# Patient Record
Sex: Female | Born: 1975
Health system: Southern US, Community
[De-identification: ages and names within clinical notes are randomized; demographics above are authoritative.]

## PROBLEM LIST (undated history)

## (undated) ENCOUNTER — Inpatient Hospital Stay: Payer: Self-pay

## (undated) DIAGNOSIS — IMO0002 Reserved for concepts with insufficient information to code with codable children: Secondary | ICD-10-CM

## (undated) DIAGNOSIS — M545 Low back pain: Secondary | ICD-10-CM

## (undated) DIAGNOSIS — S83519A Sprain of anterior cruciate ligament of unspecified knee, initial encounter: Secondary | ICD-10-CM

## (undated) DIAGNOSIS — G43109 Migraine with aura, not intractable, without status migrainosus: Secondary | ICD-10-CM

## (undated) DIAGNOSIS — M1990 Unspecified osteoarthritis, unspecified site: Secondary | ICD-10-CM

## (undated) DIAGNOSIS — G43709 Chronic migraine without aura, not intractable, without status migrainosus: Secondary | ICD-10-CM

## (undated) DIAGNOSIS — J3089 Other allergic rhinitis: Secondary | ICD-10-CM

## (undated) DIAGNOSIS — Z20821 Contact with and (suspected) exposure to Zika virus: Secondary | ICD-10-CM

## (undated) DIAGNOSIS — L700 Acne vulgaris: Secondary | ICD-10-CM

## (undated) HISTORY — DX: Migraine with aura, not intractable, without status migrainosus: G43.109

## (undated) HISTORY — DX: Low back pain: M54.5

## (undated) HISTORY — DX: Other allergic rhinitis: J30.89

## (undated) HISTORY — DX: Contact with and (suspected) exposure to Zika virus: Z20.821

---
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
## 2008-07-04 DIAGNOSIS — M545 Low back pain, unspecified: Secondary | ICD-10-CM

## 2008-07-04 HISTORY — DX: Low back pain, unspecified: M54.50

## 2013-01-11 ENCOUNTER — Ambulatory Visit: Payer: Self-pay

## 2014-10-31 ENCOUNTER — Ambulatory Visit (INDEPENDENT_AMBULATORY_CARE_PROVIDER_SITE_OTHER): Payer: Federal, State, Local not specified - PPO | Admitting: Family Medicine

## 2014-10-31 ENCOUNTER — Encounter: Payer: Self-pay | Admitting: Family Medicine

## 2014-10-31 VITALS — BP 100/52 | HR 79 | Temp 98.2°F | Ht 65.75 in | Wt 175.8 lb

## 2014-10-31 DIAGNOSIS — J3089 Other allergic rhinitis: Secondary | ICD-10-CM

## 2014-10-31 DIAGNOSIS — G43709 Chronic migraine without aura, not intractable, without status migrainosus: Secondary | ICD-10-CM | POA: Insufficient documentation

## 2014-10-31 DIAGNOSIS — M545 Low back pain, unspecified: Secondary | ICD-10-CM

## 2014-10-31 DIAGNOSIS — E739 Lactose intolerance, unspecified: Secondary | ICD-10-CM

## 2014-10-31 DIAGNOSIS — Z0000 Encounter for general adult medical examination without abnormal findings: Secondary | ICD-10-CM

## 2014-10-31 DIAGNOSIS — F331 Major depressive disorder, recurrent, moderate: Secondary | ICD-10-CM | POA: Insufficient documentation

## 2014-10-31 DIAGNOSIS — Z8739 Personal history of other diseases of the musculoskeletal system and connective tissue: Secondary | ICD-10-CM | POA: Insufficient documentation

## 2014-10-31 DIAGNOSIS — E663 Overweight: Secondary | ICD-10-CM

## 2014-10-31 DIAGNOSIS — Z23 Encounter for immunization: Secondary | ICD-10-CM

## 2014-10-31 DIAGNOSIS — F329 Major depressive disorder, single episode, unspecified: Secondary | ICD-10-CM

## 2014-10-31 DIAGNOSIS — G43109 Migraine with aura, not intractable, without status migrainosus: Secondary | ICD-10-CM

## 2014-10-31 DIAGNOSIS — F32A Depression, unspecified: Secondary | ICD-10-CM

## 2014-10-31 LAB — LIPID PANEL
CHOLESTEROL: 149 mg/dL (ref 0–200)
HDL: 45.1 mg/dL (ref >39.00)
LDL Cholesterol: 93 mg/dL (ref 0–99)
NonHDL: 103.9
TRIGLYCERIDES: 57 mg/dL (ref 0.0–149.0)
Total CHOL/HDL Ratio: 3
VLDL: 11.4 mg/dL (ref 0.0–40.0)

## 2014-10-31 LAB — COMPREHENSIVE METABOLIC PANEL
ALK PHOS: 60 U/L (ref 39–117)
ALT: 18 U/L (ref 0–35)
AST: 19 U/L (ref 0–37)
Albumin: 4.2 g/dL (ref 3.5–5.2)
BUN: 7 mg/dL (ref 6–23)
CO2: 27 meq/L (ref 19–32)
Calcium: 9.7 mg/dL (ref 8.4–10.5)
Chloride: 103 mEq/L (ref 96–112)
Creatinine, Ser: 0.86 mg/dL (ref 0.40–1.20)
GFR: 79.16 mL/min (ref >60.00)
Glucose, Bld: 77 mg/dL (ref 70–99)
POTASSIUM: 4 meq/L (ref 3.5–5.1)
Sodium: 136 mEq/L (ref 135–145)
Total Bilirubin: 0.4 mg/dL (ref 0.2–1.2)
Total Protein: 7 g/dL (ref 6.0–8.3)

## 2014-10-31 LAB — TSH: TSH: 0.64 u[IU]/mL (ref 0.35–4.50)

## 2014-10-31 MED ORDER — LORATADINE-PSEUDOEPHEDRINE ER 10-240 MG PO TB24: 1.0000 | ORAL_TABLET | Freq: Every day | ORAL | Status: DC

## 2014-10-31 MED ORDER — SUMATRIPTAN SUCCINATE 100 MG PO TABS: 100.0000 mg | ORAL_TABLET | ORAL | Status: DC | PRN

## 2014-10-31 NOTE — Assessment & Plan Note (Signed)
Preventative protocols reviewed and updated unless pt declined. Discussed healthy diet and lifestyle.  

## 2014-10-31 NOTE — Progress Notes (Signed)
Pre visit review using our clinic review tool, if applicable. No additional management support is needed unless otherwise documented below in the visit note. 

## 2014-10-31 NOTE — Assessment & Plan Note (Signed)
Continue wellbutrin and prozac and f/u with counselor. Somewhat situational - migraine related

## 2014-10-31 NOTE — Patient Instructions (Addendum)
Tdap today Blood work today. Let us know about well woman exam. meds refilled today. Return as needed or in 1 year for next physical.

## 2014-10-31 NOTE — Progress Notes (Signed)
BP 100/52 mmHg  Pulse 79  Temp(Src) 98.2 F (36.8 C) (Oral)  Ht 5' 5.75" (1.67 m)  Wt 175 lb 12 oz (79.72 kg)  BMI 28.58 kg/m2  SpO2 99%  LMP 10/12/2014   CC: new pt to establish  Subjective:    Patient ID: Amanda Stout, female    DOB: 06/21/1976, 39 y.o.   MRN: [id number]  HPI: Amanda Stout is a 39 y.o. female presenting on 10/31/2014 for Establish Care   Prior saw Willia Craze at [facility].  LBP - L4/5 burst disc, treated conservatively. Also had unsuccessful nerve blocks.   Migraines - daily migraines for 20 yrs. Has seen multiple neurologists in the past. Has been multiple daily meds without improvement (topamax, elavil, propranolol, depakote). Now just taking imitrex. Triggers are peri-cycle and weather changes.   Depression - migraine related. On wellbutrin and prozac. To see counselor Kirstie Mirza Alcott.   Preventative: Well woman - Dr Dear, normal pap 11/2013. H/o abnormal paps in the past.  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 Interim medical history since our last visit reviewed. Allergies and medications reviewed and updated. No current outpatient prescriptions on file prior to visit.   No current facility-administered medications on file prior to visit.    Review of Systems  Constitutional: Negative for fever, chills, activity change, appetite change, fatigue and unexpected weight change.  HENT: Negative for hearing loss.   Eyes: Negative for visual disturbance.  Respiratory: Negative for cough, chest tightness, shortness of breath and wheezing.   Cardiovascular: Negative for chest pain, palpitations and  leg swelling.  Gastrointestinal: Positive for nausea (migraines). Negative for vomiting, abdominal pain, diarrhea, constipation, blood in stool and abdominal distention.  Genitourinary: Negative for hematuria and difficulty urinating.  Musculoskeletal: Negative for myalgias, arthralgias and neck pain.  Skin: Negative for rash.  Neurological: Positive for dizziness (migraine related) and headaches. Negative for seizures and syncope.  Hematological: Negative for adenopathy. Does not bruise/bleed easily.  Psychiatric/Behavioral: Negative for dysphoric mood. The patient is not nervous/anxious.    Per HPI unless specifically indicated above     Objective:    BP 100/52 mmHg  Pulse 79  Temp(Src) 98.2 F (36.8 C) (Oral)  Ht 5' 5.75" (1.67 m)  Wt 175 lb 12 oz (79.72 kg)  BMI 28.58 kg/m2  SpO2 99%  LMP 10/12/2014  Wt Readings from Last 3 Encounters:  10/31/14 175 lb 12 oz (79.72 kg)    Physical Exam  Constitutional: She is oriented to person, place, and time. She appears well-developed and well-nourished. No distress.  HENT:  Head: Normocephalic and atraumatic.  Right Ear: Hearing, tympanic membrane, external ear and ear canal normal.  Left Ear: Hearing, tympanic membrane, external ear and ear canal normal.  Nose: Nose normal.  Mouth/Throat: Uvula is midline, oropharynx is clear and moist and mucous membranes are normal. No oropharyngeal exudate, posterior oropharyngeal edema or posterior oropharyngeal erythema.  Eyes: Conjunctivae and EOM are normal. Pupils are equal, round, and reactive to light. No scleral icterus.  Neck: Normal range of motion. Neck supple. No thyromegaly present.  Cardiovascular: Normal rate, regular rhythm, normal heart sounds and intact distal pulses.   No murmur heard.  Pulses:      Radial pulses are 2+ on the right side, and 2+ on the left side.  Pulmonary/Chest: Effort normal and breath sounds normal. No respiratory distress. She has no wheezes. She has no  rales.  Abdominal: Soft. Bowel sounds are normal. She exhibits no distension and no mass. There is no tenderness. There is no rebound and no guarding.  Musculoskeletal: Normal range of motion. She exhibits no edema.  Lymphadenopathy:    She has no cervical adenopathy.  Neurological: She is alert and oriented to person, place, and time.  CN grossly intact, station and gait intact  Skin: Skin is warm and dry. No rash noted.  Psychiatric: She has a normal mood and affect. Her behavior is normal. Judgment and thought content normal.  Nursing note and vitals reviewed.  No results found for this or any previous visit.    Assessment & Plan:   Problem List Items Addressed This Visit    Perennial allergic rhinitis    claritin D refiled today.      Migraines    Failed several preventative meds in the past. Now only using imitrex prn.      Relevant Medications   Aspirin-Acetaminophen-Caffeine (EXCEDRIN MIGRAINE PO)   ibuprofen (ADVIL,MOTRIN) 200 MG tablet   baclofen (LIORESAL) 10 MG tablet   buPROPion (WELLBUTRIN XL) 150 MG 24 hr tablet   FLUoxetine (PROZAC) 20 MG tablet   SUMAtriptan (IMITREX) 100 MG tablet   Lower back pain    Known HNP s/p unsuccessful nerve block Now controls with PRN ibuprofen.      Relevant Medications   Aspirin-Acetaminophen-Caffeine (EXCEDRIN MIGRAINE PO)   ibuprofen (ADVIL,MOTRIN) 200 MG tablet   baclofen (LIORESAL) 10 MG tablet   Lactose intolerance in adult   Health maintenance examination - Primary    Preventative protocols reviewed and updated unless pt declined. Discussed healthy diet and lifestyle.       Depression    Continue wellbutrin and prozac and f/u with counselor. Somewhat situational - migraine related      Relevant Medications   buPROPion (WELLBUTRIN XL) 150 MG 24 hr tablet   FLUoxetine (PROZAC) 20 MG tablet       Follow up plan: Return in about 1 year (around 10/31/2015), or as needed, for annual exam, prior fasting for blood  work.

## 2014-10-31 NOTE — Assessment & Plan Note (Addendum)
Known HNP s/p unsuccessful nerve block Now controls with PRN ibuprofen.

## 2014-10-31 NOTE — Assessment & Plan Note (Signed)
claritin D refiled today.

## 2014-10-31 NOTE — Addendum Note (Signed)
Addended by: Modena Nunnery on: 10/31/2014 10:37 AM   Modules accepted: Orders

## 2014-10-31 NOTE — Assessment & Plan Note (Signed)
Failed several preventative meds in the past. Now only using imitrex prn.

## 2014-10-31 NOTE — Addendum Note (Signed)
Addended by: Ria Bush on: 10/31/2014 10:31 AM   Modules accepted: Orders

## 2014-11-03 ENCOUNTER — Encounter: Payer: Self-pay | Admitting: *Deleted

## 2014-11-15 ENCOUNTER — Encounter: Payer: Self-pay | Admitting: Family Medicine

## 2015-04-03 ENCOUNTER — Other Ambulatory Visit: Payer: Self-pay | Admitting: Orthopedic Surgery

## 2015-04-04 DIAGNOSIS — S83519A Sprain of anterior cruciate ligament of unspecified knee, initial encounter: Secondary | ICD-10-CM

## 2015-04-04 HISTORY — DX: Sprain of anterior cruciate ligament of unspecified knee, initial encounter: S83.519A

## 2015-04-06 ENCOUNTER — Encounter (HOSPITAL_BASED_OUTPATIENT_CLINIC_OR_DEPARTMENT_OTHER): Payer: Self-pay | Admitting: *Deleted

## 2015-04-08 ENCOUNTER — Other Ambulatory Visit: Payer: Self-pay | Admitting: Orthopedic Surgery

## 2015-04-09 NOTE — Anesthesia Preprocedure Evaluation (Addendum)
Anesthesia Evaluation  Patient identified by MRN, date of birth, ID band Patient awake    Reviewed: Allergy & Precautions, NPO status , Patient's Chart, lab work & pertinent test results  Airway Mallampati: II  TM Distance: >3 FB Neck ROM: Full    Dental no notable dental hx.    Pulmonary neg pulmonary ROS,    Pulmonary exam normal breath sounds clear to auscultation       Cardiovascular negative cardio ROS Normal cardiovascular exam Rhythm:Regular Rate:Normal     Neuro/Psych  Headaches, PSYCHIATRIC DISORDERS Depression negative neurological ROS  negative psych ROS   GI/Hepatic negative GI ROS, Neg liver ROS,   Endo/Other  negative endocrine ROS  Renal/GU negative Renal ROS  negative genitourinary   Musculoskeletal negative musculoskeletal ROS (+)   Abdominal   Peds negative pediatric ROS (+)  Hematology negative hematology ROS (+)   Anesthesia Other Findings   Reproductive/Obstetrics negative OB ROS                            Anesthesia Physical Anesthesia Plan  ASA: I  Anesthesia Plan: General   Post-op Pain Management: GA combined w/ Regional for post-op pain   Induction: Intravenous  Airway Management Planned: LMA  Additional Equipment:   Intra-op Plan:   Post-operative Plan: Extubation in OR  Informed Consent: I have reviewed the patients History and Physical, chart, labs and discussed the procedure including the risks, benefits and alternatives for the proposed anesthesia with the patient or authorized representative who has indicated his/her understanding and acceptance.   Dental advisory given  Plan Discussed with: CRNA and Surgeon  Anesthesia Plan Comments:         Anesthesia Quick Evaluation

## 2015-04-10 ENCOUNTER — Ambulatory Visit (HOSPITAL_BASED_OUTPATIENT_CLINIC_OR_DEPARTMENT_OTHER)
Admission: RE | Admit: 2015-04-10 | Discharge: 2015-04-10 | Disposition: A | Discharge status: Home / Self Care | Payer: Federal, State, Local not specified - PPO | Source: Ambulatory Visit | Attending: Orthopedic Surgery | Admitting: Orthopedic Surgery

## 2015-04-10 ENCOUNTER — Ambulatory Visit (HOSPITAL_BASED_OUTPATIENT_CLINIC_OR_DEPARTMENT_OTHER): Payer: Federal, State, Local not specified - PPO | Admitting: Certified Registered"

## 2015-04-10 ENCOUNTER — Encounter (HOSPITAL_BASED_OUTPATIENT_CLINIC_OR_DEPARTMENT_OTHER): Payer: Self-pay | Admitting: Certified Registered"

## 2015-04-10 ENCOUNTER — Encounter (HOSPITAL_BASED_OUTPATIENT_CLINIC_OR_DEPARTMENT_OTHER)
Admission: RE | Disposition: A | Discharge status: Home / Self Care | Payer: Self-pay | Source: Ambulatory Visit | Attending: Orthopedic Surgery

## 2015-04-10 DIAGNOSIS — Z7982 Long term (current) use of aspirin: Secondary | ICD-10-CM | POA: Insufficient documentation

## 2015-04-10 DIAGNOSIS — M23611 Other spontaneous disruption of anterior cruciate ligament of right knee: Secondary | ICD-10-CM | POA: Diagnosis present

## 2015-04-10 DIAGNOSIS — S83511A Sprain of anterior cruciate ligament of right knee, initial encounter: Secondary | ICD-10-CM

## 2015-04-10 HISTORY — DX: Sprain of anterior cruciate ligament of unspecified knee, initial encounter: S83.519A

## 2015-04-10 HISTORY — DX: Chronic migraine without aura, not intractable, without status migrainosus: G43.709

## 2015-04-10 HISTORY — DX: Reserved for concepts with insufficient information to code with codable children: IMO0002

## 2015-04-10 HISTORY — DX: Sprain of anterior cruciate ligament of right knee, initial encounter: S83.511A

## 2015-04-10 HISTORY — DX: Unspecified osteoarthritis, unspecified site: M19.90

## 2015-04-10 HISTORY — PX: KNEE ARTHROSCOPY WITH ANTERIOR CRUCIATE LIGAMENT (ACL) REPAIR: SHX5644

## 2015-04-10 LAB — POCT HEMOGLOBIN-HEMACUE: HEMOGLOBIN: 13.2 g/dL (ref 12.0–15.0)

## 2015-04-10 SURGERY — KNEE ARTHROSCOPY WITH ANTERIOR CRUCIATE LIGAMENT (ACL) REPAIR
Anesthesia: General | Site: Knee | Laterality: Right

## 2015-04-10 MED ORDER — FENTANYL CITRATE (PF) 100 MCG/2ML IJ SOLN
INTRAMUSCULAR | Status: AC
  Filled 2015-04-10: qty 4

## 2015-04-10 MED ORDER — SENNA-DOCUSATE SODIUM 8.6-50 MG PO TABS: 2.0000 | ORAL_TABLET | Freq: Every day | ORAL | Status: DC

## 2015-04-10 MED ORDER — HYDROMORPHONE HCL 1 MG/ML IJ SOLN
INTRAMUSCULAR | Status: AC
  Filled 2015-04-10: qty 1

## 2015-04-10 MED ORDER — GLYCOPYRROLATE 0.2 MG/ML IJ SOLN: 0.2000 mg | Freq: Once | INTRAMUSCULAR | Status: DC | PRN

## 2015-04-10 MED ORDER — OXYCODONE HCL 5 MG PO TABS
ORAL_TABLET | ORAL | Status: AC
  Filled 2015-04-10: qty 1

## 2015-04-10 MED ORDER — KETOROLAC TROMETHAMINE 30 MG/ML IJ SOLN: 30.0000 mg | Freq: Once | INTRAMUSCULAR | Status: DC

## 2015-04-10 MED ORDER — OXYCODONE HCL 5 MG PO TABS
5.0000 mg | ORAL_TABLET | Freq: Once | ORAL | Status: AC
  Administered 2015-04-10: 5 mg via ORAL

## 2015-04-10 MED ORDER — MIDAZOLAM HCL 2 MG/2ML IJ SOLN
INTRAMUSCULAR | Status: AC
  Filled 2015-04-10: qty 4

## 2015-04-10 MED ORDER — PROMETHAZINE HCL 12.5 MG RE SUPP
RECTAL | Status: AC
  Filled 2015-04-10: qty 1

## 2015-04-10 MED ORDER — ONDANSETRON HCL 4 MG/2ML IJ SOLN
INTRAMUSCULAR | Status: AC
  Filled 2015-04-10: qty 2

## 2015-04-10 MED ORDER — LIDOCAINE HCL (CARDIAC) 20 MG/ML IV SOLN
INTRAVENOUS | Status: DC | PRN
  Administered 2015-04-10: 30 mg via INTRAVENOUS

## 2015-04-10 MED ORDER — LACTATED RINGERS IV SOLN
INTRAVENOUS | Status: DC
  Administered 2015-04-10 (×2): via INTRAVENOUS

## 2015-04-10 MED ORDER — FENTANYL CITRATE (PF) 100 MCG/2ML IJ SOLN
INTRAMUSCULAR | Status: AC
  Filled 2015-04-10: qty 2

## 2015-04-10 MED ORDER — ONDANSETRON HCL 4 MG/2ML IJ SOLN
INTRAMUSCULAR | Status: DC | PRN
  Administered 2015-04-10: 4 mg via INTRAVENOUS

## 2015-04-10 MED ORDER — SODIUM CHLORIDE 0.9 % IR SOLN
Status: DC | PRN
  Administered 2015-04-10: 3000 mL

## 2015-04-10 MED ORDER — CEFAZOLIN SODIUM-DEXTROSE 2-3 GM-% IV SOLR
2.0000 g | INTRAVENOUS | Status: AC
  Administered 2015-04-10: 2 g via INTRAVENOUS

## 2015-04-10 MED ORDER — SCOPOLAMINE 1 MG/3DAYS TD PT72: 1.0000 | MEDICATED_PATCH | Freq: Once | TRANSDERMAL | Status: DC | PRN

## 2015-04-10 MED ORDER — LIDOCAINE HCL (CARDIAC) 20 MG/ML IV SOLN
INTRAVENOUS | Status: AC
  Filled 2015-04-10: qty 5

## 2015-04-10 MED ORDER — LIDOCAINE-EPINEPHRINE (PF) 1.5 %-1:200000 IJ SOLN
INTRAMUSCULAR | Status: DC | PRN
  Administered 2015-04-10: 15 mL via PERINEURAL

## 2015-04-10 MED ORDER — MIDAZOLAM HCL 2 MG/2ML IJ SOLN
INTRAMUSCULAR | Status: AC
  Filled 2015-04-10: qty 2

## 2015-04-10 MED ORDER — CEFAZOLIN SODIUM-DEXTROSE 2-3 GM-% IV SOLR
INTRAVENOUS | Status: AC
  Filled 2015-04-10: qty 50

## 2015-04-10 MED ORDER — PROPOFOL 500 MG/50ML IV EMUL
INTRAVENOUS | Status: AC
  Filled 2015-04-10: qty 50

## 2015-04-10 MED ORDER — MIDAZOLAM HCL 2 MG/2ML IJ SOLN
1.0000 mg | INTRAMUSCULAR | Status: DC | PRN
Start: 2015-04-10 — End: 2015-04-10
  Administered 2015-04-10: 2 mg via INTRAVENOUS
  Administered 2015-04-10: 1 mg via INTRAVENOUS

## 2015-04-10 MED ORDER — PROMETHAZINE HCL 25 MG/ML IJ SOLN: 6.2500 mg | INTRAMUSCULAR | Status: DC | PRN

## 2015-04-10 MED ORDER — FENTANYL CITRATE (PF) 100 MCG/2ML IJ SOLN
50.0000 ug | INTRAMUSCULAR | Status: AC | PRN
  Administered 2015-04-10: 100 ug via INTRAVENOUS
  Administered 2015-04-10 (×2): 25 ug via INTRAVENOUS
  Administered 2015-04-10: 50 ug via INTRAVENOUS

## 2015-04-10 MED ORDER — BUPIVACAINE HCL (PF) 0.5 % IJ SOLN
INTRAMUSCULAR | Status: DC | PRN
  Administered 2015-04-10: 30 mL via PERINEURAL

## 2015-04-10 MED ORDER — DEXAMETHASONE SODIUM PHOSPHATE 10 MG/ML IJ SOLN
INTRAMUSCULAR | Status: DC | PRN
  Administered 2015-04-10: 10 mg via INTRAVENOUS

## 2015-04-10 MED ORDER — DEXAMETHASONE SODIUM PHOSPHATE 10 MG/ML IJ SOLN
INTRAMUSCULAR | Status: AC
  Filled 2015-04-10: qty 1

## 2015-04-10 MED ORDER — ONDANSETRON HCL 4 MG PO TABS: 4.0000 mg | ORAL_TABLET | Freq: Three times a day (TID) | ORAL | Status: DC | PRN

## 2015-04-10 MED ORDER — CEFAZOLIN SODIUM-DEXTROSE 2-3 GM-% IV SOLR: 2.0000 g | INTRAVENOUS | Status: DC

## 2015-04-10 MED ORDER — HYDROMORPHONE HCL 1 MG/ML IJ SOLN
0.2500 mg | INTRAMUSCULAR | Status: DC | PRN
  Administered 2015-04-10 (×3): 0.5 mg via INTRAVENOUS

## 2015-04-10 MED ORDER — BACLOFEN 10 MG PO TABS: 10.0000 mg | ORAL_TABLET | Freq: Three times a day (TID) | ORAL | Status: DC

## 2015-04-10 MED ORDER — PROPOFOL 10 MG/ML IV BOLUS
INTRAVENOUS | Status: DC | PRN
  Administered 2015-04-10: 200 mg via INTRAVENOUS

## 2015-04-10 MED ORDER — OXYCODONE-ACETAMINOPHEN 10-325 MG PO TABS: 1.0000 | ORAL_TABLET | Freq: Four times a day (QID) | ORAL | Status: DC | PRN

## 2015-04-10 SURGICAL SUPPLY — 92 items
ANCHOR BUTTON TIGHTROPE ACL RT (Orthopedic Implant) ×2 IMPLANT
BANDAGE ELASTIC 6 VELCRO ST LF (GAUZE/BANDAGES/DRESSINGS) ×2 IMPLANT
BANDAGE ESMARK 6X9 LF (GAUZE/BANDAGES/DRESSINGS) IMPLANT
BLADE 4.2CUDA (BLADE) IMPLANT
BLADE CUDA GRT WHITE 3.5 (BLADE) IMPLANT
BLADE CUDA SHAVER 3.5 (BLADE) IMPLANT
BLADE CUTTER GATOR 3.5 (BLADE) ×2 IMPLANT
BLADE GREAT WHITE 4.2 (BLADE) IMPLANT
BLADE SURG 15 STRL LF DISP TIS (BLADE) ×1 IMPLANT
BLADE SURG 15 STRL SS (BLADE) ×1
BNDG ESMARK 6X9 LF (GAUZE/BANDAGES/DRESSINGS)
BUR OVAL 6.0 (BURR) ×2 IMPLANT
CLSR STERI-STRIP ANTIMIC 1/2X4 (GAUZE/BANDAGES/DRESSINGS) ×2 IMPLANT
COVER BACK TABLE 60X90IN (DRAPES) ×2 IMPLANT
CUFF TOURNIQUET SINGLE 34IN LL (TOURNIQUET CUFF) ×2 IMPLANT
CUTTER FLIP II 9.5MM (INSTRUMENTS) IMPLANT
CUTTER MENISCUS  4.2MM (BLADE)
CUTTER MENISCUS 4.2MM (BLADE) IMPLANT
DECANTER SPIKE VIAL GLASS SM (MISCELLANEOUS) IMPLANT
DRAPE ARTHROSCOPY W/POUCH 90 (DRAPES) ×2 IMPLANT
DRAPE OEC MINIVIEW 54X84 (DRAPES) ×2 IMPLANT
DRAPE U 20/CS (DRAPES) ×4 IMPLANT
DRAPE U-SHAPE 47X51 STRL (DRAPES) ×2 IMPLANT
DRILL FLIPCUTTER II 10.5MM (CUTTER) IMPLANT
DRILL FLIPCUTTER II 10MM (CUTTER) IMPLANT
DRILL FLIPCUTTER II 7.0MM (INSTRUMENTS) IMPLANT
DRILL FLIPCUTTER II 7.5MM (MISCELLANEOUS) IMPLANT
DRILL FLIPCUTTER II 8.0MM (INSTRUMENTS) IMPLANT
DRILL FLIPCUTTER II 8.5MM (INSTRUMENTS) IMPLANT
DRILL FLIPCUTTER II 9.0MM (INSTRUMENTS) ×1 IMPLANT
DURAPREP 26ML APPLICATOR (WOUND CARE) ×2 IMPLANT
ELECT REM PT RETURN 9FT ADLT (ELECTROSURGICAL) ×2
ELECTRODE REM PT RTRN 9FT ADLT (ELECTROSURGICAL) ×1 IMPLANT
FIBERSTICK 2 (SUTURE) ×2 IMPLANT
FLIP CUTTER II 7.0MM (INSTRUMENTS)
FLIPCUTTER II 10.5MM (CUTTER)
FLIPCUTTER II 10MM (CUTTER)
FLIPCUTTER II 7.5MM (MISCELLANEOUS)
FLIPCUTTER II 8.0MM (INSTRUMENTS)
FLIPCUTTER II 8.5MM (INSTRUMENTS)
FLIPCUTTER II 9.0MM (INSTRUMENTS) ×2
GAUZE SPONGE 4X4 12PLY STRL (GAUZE/BANDAGES/DRESSINGS) ×2 IMPLANT
GLOVE BIO SURGEON STRL SZ 6.5 (GLOVE) ×4 IMPLANT
GLOVE BIO SURGEON STRL SZ8 (GLOVE) ×2 IMPLANT
GLOVE BIOGEL PI IND STRL 7.0 (GLOVE) ×1 IMPLANT
GLOVE BIOGEL PI IND STRL 8 (GLOVE) ×2 IMPLANT
GLOVE BIOGEL PI INDICATOR 7.0 (GLOVE) ×1
GLOVE BIOGEL PI INDICATOR 8 (GLOVE) ×2
GLOVE ORTHO TXT STRL SZ7.5 (GLOVE) ×2 IMPLANT
GOWN STRL REUS W/ TWL LRG LVL3 (GOWN DISPOSABLE) ×1 IMPLANT
GOWN STRL REUS W/ TWL XL LVL3 (GOWN DISPOSABLE) ×2 IMPLANT
GOWN STRL REUS W/TWL LRG LVL3 (GOWN DISPOSABLE) ×1
GOWN STRL REUS W/TWL XL LVL3 (GOWN DISPOSABLE) ×2
GUIDEPIN REAMER CUTTER 11MM (INSTRUMENTS) IMPLANT
IMMOBILIZER KNEE 22 UNIV (SOFTGOODS) ×2 IMPLANT
IMMOBILIZER KNEE 24 THIGH 36 (MISCELLANEOUS) ×1 IMPLANT
IMMOBILIZER KNEE 24 UNIV (MISCELLANEOUS) ×2
IV NS IRRIG 3000ML ARTHROMATIC (IV SOLUTION) ×4 IMPLANT
KIT TRANSTIBIAL (DISPOSABLE) IMPLANT
KNEE WRAP E Z 3 GEL PACK (MISCELLANEOUS) ×2 IMPLANT
MANIFOLD NEPTUNE II (INSTRUMENTS) ×2 IMPLANT
NS IRRIG 1000ML POUR BTL (IV SOLUTION) ×2 IMPLANT
PACK ARTHROSCOPY DSU (CUSTOM PROCEDURE TRAY) ×2 IMPLANT
PACK BASIN DAY SURGERY FS (CUSTOM PROCEDURE TRAY) ×2 IMPLANT
PAD CAST 4YDX4 CTTN HI CHSV (CAST SUPPLIES) IMPLANT
PADDING CAST COTTON 4X4 STRL (CAST SUPPLIES)
PADDING CAST COTTON 6X4 STRL (CAST SUPPLIES) ×2 IMPLANT
PENCIL BUTTON HOLSTER BLD 10FT (ELECTRODE) IMPLANT
SCREW INTERFERENCE BIOCOMPOSITE 9 X 28MM (Screw) ×2 IMPLANT
SET ARTHROSCOPY TUBING (MISCELLANEOUS) ×1
SET ARTHROSCOPY TUBING LN (MISCELLANEOUS) ×1 IMPLANT
SLEEVE SCD COMPRESS KNEE MED (MISCELLANEOUS) ×2 IMPLANT
SPONGE LAP 4X18 X RAY DECT (DISPOSABLE) ×2 IMPLANT
SUCTION FRAZIER TIP 10 FR DISP (SUCTIONS) IMPLANT
SUT 2 FIBERLOOP 20 STRT BLUE (SUTURE) ×4
SUT FIBERWIRE #2 38 T-5 BLUE (SUTURE)
SUT MNCRL AB 4-0 PS2 18 (SUTURE) IMPLANT
SUT VIC AB 0 CT1 27 (SUTURE)
SUT VIC AB 0 CT1 27XBRD ANBCTR (SUTURE) IMPLANT
SUT VIC AB 2-0 SH 27 (SUTURE)
SUT VIC AB 2-0 SH 27XBRD (SUTURE) IMPLANT
SUT VIC AB 3-0 SH 27 (SUTURE)
SUT VIC AB 3-0 SH 27X BRD (SUTURE) IMPLANT
SUT VICRYL 3-0 CR8 SH (SUTURE) IMPLANT
SUT VICRYL 4-0 PS2 18IN ABS (SUTURE) IMPLANT
SUTURE 2 FIBERLOOP 20 STRT BLU (SUTURE) ×2 IMPLANT
SUTURE FIBERWR #2 38 T-5 BLUE (SUTURE) IMPLANT
TENDON ANTERIOR TIBIALIS (Tissue) ×2 IMPLANT
TOWEL OR 17X24 6PK STRL BLUE (TOWEL DISPOSABLE) ×2 IMPLANT
TOWEL OR NON WOVEN STRL DISP B (DISPOSABLE) ×2 IMPLANT
WAND STAR VAC 90 (SURGICAL WAND) ×2 IMPLANT
WATER STERILE IRR 1000ML POUR (IV SOLUTION) ×2 IMPLANT

## 2015-04-10 NOTE — Discharge Instructions (Signed)
Diet: As you were doing prior to hospitalization  ° °Shower:  May shower but keep the wounds dry, use an occlusive plastic wrap, NO SOAKING IN TUB.  If the bandage gets wet, change with a clean dry gauze.  If you have a splint on, leave the splint in place and keep the splint dry with a plastic bag. ° °Dressing:  You may change your dressing 3-5 days after surgery, unless you have a splint.  If you have a splint, then just leave the splint in place and we will change your bandages during your first follow-up appointment.   ° °If you had hand or foot surgery, we will plan to remove your stitches in about 2 weeks in the office.  For all other surgeries, there are sticky tapes (steri-strips) on your wounds and all the stitches are absorbable.  Leave the steri-strips in place when changing your dressings, they will peel off with time, usually 2-3 weeks. ° °Activity:  Increase activity slowly as tolerated, but follow the weight bearing instructions below.  The rules on driving is that you can not be taking narcotics while you drive, and you must feel in control of the vehicle.   ° °Weight Bearing:   As tolerated.   ° °To prevent constipation: you may use a stool softener such as - ° °Colace (over the counter) 100 mg by mouth twice a day  °Drink plenty of fluids (prune juice may be helpful) and high fiber foods °Miralax (over the counter) for constipation as needed.   ° °Itching:  If you experience itching with your medications, try taking only a single pain pill, or even half a pain pill at a time.  You may take up to 10 pain pills per day, and you can also use benadryl over the counter for itching or also to help with sleep.  ° °Precautions:  If you experience chest pain or shortness of breath - call 911 immediately for transfer to the hospital emergency department!! ° °If you develop a fever greater that 101 F, purulent drainage from wound, increased redness or drainage from wound, or calf pain -- Call the office at  [phone]                                                °Follow- Up Appointment:  Please call for an appointment to be seen in 2 weeks [place] - [phone] ° ° ° ° ° °Post Anesthesia Home Care Instructions ° °Activity: °Get plenty of rest for the remainder of the day. A responsible adult should stay with you for 24 hours following the procedure.  °For the next 24 hours, DO NOT: °-Drive a car °-Operate machinery °-Drink alcoholic beverages °-Take any medication unless instructed by your physician °-Make any legal decisions or sign important papers. ° °Meals: °Start with liquid foods such as gelatin or soup. Progress to regular foods as tolerated. Avoid greasy, spicy, heavy foods. If nausea and/or vomiting occur, drink only clear liquids until the nausea and/or vomiting subsides. Call your physician if vomiting continues. ° °Special Instructions/Symptoms: °Your throat may feel dry or sore from the anesthesia or the breathing tube placed in your throat during surgery. If this causes discomfort, gargle with warm salt water. The discomfort should disappear within 24 hours. ° °If you had a scopolamine patch placed behind your ear for the management   of post- operative nausea and/or vomiting:  1. The medication in the patch is effective for 72 hours, after which it should be removed.  Wrap patch in a tissue and discard in the trash. Wash hands thoroughly with soap and water. 2. You may remove the patch earlier than 72 hours if you experience unpleasant side effects which may include dry mouth, dizziness or visual disturbances. 3. Avoid touching the patch. Wash your hands with soap and water after contact with the patch.     Regional Anesthesia Blocks  1. Numbness or the inability to move the "blocked" extremity may last from 3-48 hours after placement. The length of time depends on the medication injected and your individual response to the medication. If the numbness is not going away after 48 hours,  call your surgeon.  2. The extremity that is blocked will need to be protected until the numbness is gone and the  Strength has returned. Because you cannot feel it, you will need to take extra care to avoid injury. Because it may be weak, you may have difficulty moving it or using it. You may not know what position it is in without looking at it while the block is in effect.  3. For blocks in the legs and feet, returning to weight bearing and walking needs to be done carefully. You will need to wait until the numbness is entirely gone and the strength has returned. You should be able to move your leg and foot normally before you try and bear weight or walk. You will need someone to be with you when you first try to ensure you do not fall and possibly risk injury.  4. Bruising and tenderness at the needle site are common side effects and will resolve in a few days.  5. Persistent numbness or new problems with movement should be communicated to the surgeon or the [place] [phone] [place] [phone]).

## 2015-04-10 NOTE — Transfer of Care (Signed)
Immediate Anesthesia Transfer of Care Note  Patient: Amanda Stout  Procedure(s) Performed: Procedure(s): RIGHT KNEE ARTHROSCOPY WITH ANTERIOR CRUCIATE LIGAMENT (ACL) REPAIR (Right)  Patient Location: PACU  Anesthesia Type:GA combined with regional for post-op pain  Level of Consciousness: awake, alert , oriented and patient cooperative  Airway & Oxygen Therapy: Patient Spontanous Breathing and Patient connected to face mask oxygen  Post-op Assessment: Report given to RN and Post -op Vital signs reviewed and stable  Post vital signs: Reviewed and stable  Last Vitals:  Filed Vitals:   04/10/15 0911  BP:   Pulse: 77  Temp:   Resp: 17    Complications: No apparent anesthesia complications

## 2015-04-10 NOTE — Progress Notes (Signed)
Assisted Dr. Kalman Shan with right, ultrasound guided, lumbar plexis popliteal block. Side rails up, monitors on throughout procedure. See vital signs in flow sheet. Tolerated Procedure well.

## 2015-04-10 NOTE — Anesthesia Postprocedure Evaluation (Signed)
  Anesthesia Post-op Note  Patient: Amanda Stout  Procedure(s) Performed: Procedure(s) (LRB): RIGHT KNEE ARTHROSCOPY WITH ANTERIOR CRUCIATE LIGAMENT (ACL) REPAIR (Right)  Patient Location: PACU  Anesthesia Type: GA combined with regional for post-op pain  Level of Consciousness: awake and alert   Airway and Oxygen Therapy: Patient Spontanous Breathing  Post-op Pain: mild  Post-op Assessment: Post-op Vital signs reviewed, Patient's Cardiovascular Status Stable, Respiratory Function Stable, Patent Airway and No signs of Nausea or vomiting  Last Vitals:  Filed Vitals:   04/10/15 1112  BP: 101/64  Pulse: 76  Temp: 36.7 C  Resp: 18    Post-op Vital Signs: stable   Complications: No apparent anesthesia complications

## 2015-04-10 NOTE — Anesthesia Procedure Notes (Addendum)
Anesthesia Regional Block:  Femoral nerve block  Pre-Anesthetic Checklist: ,, timeout performed, Correct Patient, Correct Site, Correct Laterality, Correct Procedure, Correct Position, site marked, Risks and benefits discussed,  Surgical consent,  Pre-op evaluation,  At surgeon's request and post-op pain management  Laterality: Right  Prep: chloraprep       Needles:  Injection technique: Single-shot  Needle Type: Stimiplex     Needle Length: 9cm 9 cm Needle Gauge: 21 and 21 G    Additional Needles:  Procedures: nerve stimulator Femoral nerve block  Nerve Stimulator or Paresthesia:  Response: 0.4 mA,   Additional Responses:   Narrative:  Injection made incrementally with aspirations every 5 mL.  Performed by: Personally   Additional Notes: Patient tolerated the procedure well without complications  This was a lumbar plexus block   Anesthesia Regional Block:  Popliteal block  Pre-Anesthetic Checklist: ,, timeout performed, Correct Patient, Correct Site, Correct Laterality, Correct Procedure, Correct Position, site marked, Risks and benefits discussed,  Surgical consent,  Pre-op evaluation,  At surgeon's request and post-op pain management  Laterality: Right  Prep: chloraprep       Needles:  Injection technique: Single-shot  Needle Type: Echogenic Stimulator Needle     Needle Length: 9cm 9 cm Needle Gauge: 21 and 21 G    Additional Needles:  Procedures: ultrasound guided (picture in chart) Popliteal block  Nerve Stimulator or Paresthesia:  Response: 0.5 mA,   Additional Responses:   Narrative:  Injection made incrementally with aspirations every 5 mL.  Performed by: Personally   Additional Notes: Patient tolerated the procedure well without complications   Procedure Name: LMA Insertion Date/Time: 04/10/2015 7:32 AM Performed by: Rosy Estabrook D Pre-anesthesia Checklist: Patient identified, Emergency Drugs available, Suction available and  Patient being monitored Patient Re-evaluated:Patient Re-evaluated prior to inductionOxygen Delivery Method: Circle System Utilized Preoxygenation: Pre-oxygenation with 100% oxygen Intubation Type: IV induction Ventilation: Mask ventilation without difficulty LMA: LMA inserted LMA Size: 4.0 Number of attempts: 1 Airway Equipment and Method: Bite block Placement Confirmation: positive ETCO2 Tube secured with: Tape Dental Injury: Teeth and Oropharynx as per pre-operative assessment

## 2015-04-10 NOTE — Op Note (Signed)
04/10/2015  8:56 AM  PATIENT:  Amanda Stout    PRE-OPERATIVE DIAGNOSIS:  Right ACL tear  POST-OPERATIVE DIAGNOSIS:  Same  PROCEDURE:  RIGHT KNEE ARTHROSCOPY WITH ANTERIOR CRUCIATE LIGAMENT (ACL) RECONSTRUCTION  SURGEON:  Johnny Bridge, MD  PHYSICIAN ASSISTANT: Joya Gaskins, OPA-C, present and scrubbed throughout the case, critical for completion in a timely fashion, and for retraction, instrumentation, and closure.  ANESTHESIA:   General  PREOPERATIVE INDICATIONS:  Amanda Stout is a  39 y.o. female with a diagnosis of other spontaneous disruption of anterior cruciate ligament of right knee  M23.611 who failed conservative measures and elected for surgical management.    The risks benefits and alternatives were discussed with the patient preoperatively including but not limited to the risks of infection, bleeding, nerve injury, stiffness, cardiopulmonary complications, the need for revision surgery, recurrent instability, progression of arthritis, the potential for use of a allograft and related disease transmission risks, among others and the patient was willing to proceed.  .  OPERATIVE IMPLANTS: Arthrex anterior cruciate ligament tightrope, 9x29 bio composite tibial interference screw and size 9 tibialis anterior allograft  OPERATIVE FINDINGS: The anterior cruciate ligament was completely torn. The PCL was intact. The posterior lateral corner was intact to dial testing. No meniscal or chondral pathology in all three compartments.  UNIQUE ASPECTS OF THE CASE:  Prominent intercondylar ridge, removed with a burr.   OPERATIVE PROCEDURE: The patient was brought to the operating room and placed in the supine position. General anesthesia was administered. IV antibiotics were given. The lower extremity was prepped and draped in usual sterile fashion. Exam under anesthesia demonstrated the above-named findings. Time out was performed.  Knee arthroscopy was then performed, and the above  named findings were noted.    The anterior cruciate ligament was torn.  I then removed the previous anterior cruciate ligament stump, and performed a mild notchplasty.  The outside in guide was then applied to the appropriate position and the retro-cutter was used to drill the femoral socket. Care was taken to maintain the cortical bridge.  I then drilled the tibial tunnel using the retro-cutter, and opened the cortex with a reamer. All the soft tissue remnants were removed and cleaned at the aperture of the tunnel.  I also dilated with the appropriate dilators.  The passing suture was delivered through the tibia, and then the button and graft delivered up into the femoral tunnel.  The button was flipped and confirmed under live fluoroscopy. I then tensioned the anterior cruciate ligament tightrope, and deliver the graft up into the femoral tunnel. Over 25 mm of graft was in the femoral tunnel. I confirmed once more with the fluoroscopy that the button was flipped appropriately on the femoral cortex.  I then cycled the knee, eliminated all of the creep, and I had excellent isometry. I then applied tension, and the Arthrex bio composite interference screw into the tibia placing a reverse Lachman maneuver on the tibia and femur. I removed the guide pin prior to completely seating the screw.  Excellent fixation was achieved on both the femoral and tibial side, and the wounds were irrigated copiously and the sartorius fascia repaired with Vicryl, and the portals repaired with Monocryl with Steri-Strips and sterile gauze.  The patient was awakened and returned to PACU in stable and satisfactory condition. There were no complications and She tolerated the procedure well.

## 2015-04-10 NOTE — H&P (Signed)
PREOPERATIVE H&P  Chief Complaint: Right anterior cruciate ligament tear  HPI: Amanda Stout is a 39 y.o. female who presents for preoperative history and physical with a diagnosis of other spontaneous disruption of anterior cruciate ligament of right knee  M23.611. Symptoms are rated as moderate to severe, and have been worsening.  This is significantly impairing activities of daily living.  She has elected for surgical management. She tore her anterior cruciate ligament while playing volleyball September 13. She felt the deep pop and had immediate swelling. Denies previous episodes of instability.  Past Medical History  Diagnosis Date  . Lower back pain 2010    s/p herniated disc  . Chronic migraine   . Arthritis     L4-5  . ACL injury tear 04/2015    right   Past Surgical History  Procedure Laterality Date  . Wisdom tooth extraction  1998   Social History   Social History  . Marital Status: Single    Spouse Name: N/A  . Number of Children: N/A  . Years of Education: N/A   Social History Main Topics  . Smoking status: Never Smoker   . Smokeless tobacco: Never Used  . Alcohol Use: Yes     Comment: 2 x/week  . Drug Use: No  . Sexual Activity: Not Currently   Other Topics Concern  . None   Social History Narrative   Lives alone, 1 dog and 3 cats   Occupation: Passenger transport manager for [employer]   Edu: BS   Activity: walking dog, more intense exercises hurts back   Diet: good water, fruits/vegetables daily   Family History  Problem Relation Age of Onset  . Cancer Mother 7    uterine  . Hyperlipidemia Father   . Hypertension Father   . Stroke Maternal Grandmother   . CAD Maternal Grandfather 32    MI  . Hypertension Maternal Grandfather   . CAD Paternal Grandfather 15    MI  . Hypertension Paternal Grandfather    Allergies  Allergen Reactions  . Lactose Intolerance (Gi) Other (See Comments)    GI UPSET   Prior to Admission medications   Medication Sig Start  Date End Date Taking? Authorizing Provider  acetaminophen (TYLENOL) 325 MG tablet Take 650 mg by mouth every 6 (six) hours as needed.   Yes Historical Provider, MD  aspirin-acetaminophen-caffeine (EXCEDRIN MIGRAINE) [phone] MG tablet Take by mouth every 6 (six) hours as needed for headache.   Yes Historical Provider, MD  FLUoxetine (PROZAC) 10 MG capsule Take 10 mg by mouth daily as needed.   Yes Historical Provider, MD  ibuprofen (ADVIL,MOTRIN) 200 MG tablet Take 800 mg by mouth every 6 (six) hours as needed.   Yes Historical Provider, MD  loratadine-pseudoephedrine (CLARITIN-D 12-HOUR) 5-120 MG tablet Take 1 tablet by mouth 2 (two) times daily.   Yes Historical Provider, MD  Prenatal Multivit-Min-Fe-FA (PRE-NATAL PO) Take by mouth.   Yes Historical Provider, MD  SUMAtriptan (IMITREX) 100 MG tablet Take 100 mg by mouth every 2 (two) hours as needed for migraine. May repeat in 2 hours if headache persists or recurs.   Yes Historical Provider, MD     Positive ROS: All other systems have been reviewed and were otherwise negative with the exception of those mentioned in the HPI and as above.  Physical Exam: General: Alert, no acute distress Cardiovascular: No pedal edema Respiratory: No cyanosis, no use of accessory musculature GI: No organomegaly, abdomen is soft and non-tender Skin: No  lesions in the area of chief complaint Neurologic: Sensation intact distally Psychiatric: Patient is competent for consent with normal mood and affect Lymphatic: No axillary or cervical lymphadenopathy  MUSCULOSKELETAL: Right knee has positive Lachman, range of motion from 0-125, minimal effusion, stable to varus and valgus stress.  Assessment: Right anterior cruciate ligament tear   Plan: Plan for Procedure(s): RIGHT KNEE ARTHROSCOPY WITH ANTERIOR CRUCIATE LIGAMENT (ACL) reconstruction  The risks benefits and alternatives were discussed with the patient including but not limited to the risks of  nonoperative treatment, versus surgical intervention including infection, bleeding, nerve injury,  blood clots, cardiopulmonary complications, morbidity, mortality, among others, and they were willing to proceed. We also discussed the risks for disease transmission, recurrent instability, posttraumatic arthritis.  Johnny Bridge, MD Cell [phone]   04/10/2015 7:21 AM

## 2015-04-14 ENCOUNTER — Encounter (HOSPITAL_BASED_OUTPATIENT_CLINIC_OR_DEPARTMENT_OTHER): Payer: Self-pay | Admitting: Orthopedic Surgery

## 2015-04-15 ENCOUNTER — Encounter (HOSPITAL_BASED_OUTPATIENT_CLINIC_OR_DEPARTMENT_OTHER): Payer: Self-pay | Admitting: Orthopedic Surgery

## 2015-05-01 ENCOUNTER — Encounter: Payer: Self-pay | Admitting: Family Medicine

## 2015-05-01 ENCOUNTER — Ambulatory Visit (INDEPENDENT_AMBULATORY_CARE_PROVIDER_SITE_OTHER): Payer: Federal, State, Local not specified - PPO | Admitting: Family Medicine

## 2015-05-01 VITALS — BP 118/72 | HR 88 | Temp 98.7°F | Wt 183.0 lb

## 2015-05-01 DIAGNOSIS — J3089 Other allergic rhinitis: Secondary | ICD-10-CM

## 2015-05-01 DIAGNOSIS — J309 Allergic rhinitis, unspecified: Secondary | ICD-10-CM | POA: Diagnosis not present

## 2015-05-01 DIAGNOSIS — J019 Acute sinusitis, unspecified: Secondary | ICD-10-CM | POA: Diagnosis not present

## 2015-05-01 MED ORDER — MONTELUKAST SODIUM 10 MG PO TABS: 10.0000 mg | ORAL_TABLET | Freq: Every day | ORAL | Status: DC

## 2015-05-01 MED ORDER — AMOXICILLIN-POT CLAVULANATE 875-125 MG PO TABS: 1.0000 | ORAL_TABLET | Freq: Two times a day (BID) | ORAL | Status: AC

## 2015-05-01 NOTE — Assessment & Plan Note (Signed)
Given recent orthopedic surgery, treat aggressively with augmentin course. Further supportive care per instructions. Update if not improving with treatment.

## 2015-05-01 NOTE — Progress Notes (Signed)
BP 118/72 mmHg  Pulse 88  Temp(Src) 98.7 F (37.1 C) (Oral)  Wt 183 lb (83.008 kg)  LMP 04/09/2015   CC: sinusitis?  Subjective:    Patient ID: Amanda Stout, female    DOB: 08-26-1975, 39 y.o.   MRN: [id number]  HPI: Amanda Stout is a 39 y.o. female presenting on 05/01/2015 for Sinusitis   4d h/o sinus pressure headache, R>L earache. Today notes some pressure relief. Chills at night time. + ST and PNDrainage.   No tooth pain, cough.   + sick contacts at home. Taking excedrin migraine. No h/o asthma. + allergic rhinitis, nasal steroids haven't helped. Takes claritin D daily.  Recent ACL replacement by Dr Mardelle Matte.   Relevant past medical, surgical, family and social history reviewed and updated as indicated. Interim medical history since our last visit reviewed. Allergies and medications reviewed and updated. Current Outpatient Prescriptions on File Prior to Visit  Medication Sig  . aspirin-acetaminophen-caffeine (EXCEDRIN MIGRAINE) 250-250-65 MG tablet Take by mouth every 6 (six) hours as needed for headache.  . baclofen (LIORESAL) 10 MG tablet Take 1 tablet (10 mg total) by mouth 3 (three) times daily. As needed for muscle spasm  . FLUoxetine (PROZAC) 10 MG capsule Take 10 mg by mouth daily as needed.  . loratadine-pseudoephedrine (CLARITIN-D 12-HOUR) 5-120 MG tablet Take 1 tablet by mouth 2 (two) times daily.  . ondansetron (ZOFRAN) 4 MG tablet Take 1 tablet (4 mg total) by mouth every 8 (eight) hours as needed for nausea or vomiting.  . SUMAtriptan (IMITREX) 100 MG tablet Take 100 mg by mouth every 2 (two) hours as needed for migraine. May repeat in 2 hours if headache persists or recurs.  . Prenatal Multivit-Min-Fe-FA (PRE-NATAL PO) Take by mouth.   No current facility-administered medications on file prior to visit.    Review of Systems Per HPI unless specifically indicated in ROS section     Objective:    BP 118/72 mmHg  Pulse 88  Temp(Src) 98.7 F (37.1  C) (Oral)  Wt 183 lb (83.008 kg)  LMP 04/09/2015  Wt Readings from Last 3 Encounters:  05/01/15 183 lb (83.008 kg)  04/10/15 181 lb (82.101 kg)  10/31/14 175 lb 12 oz (79.72 kg)    Physical Exam  Constitutional: She appears well-developed and well-nourished. No distress.  HENT:  Head: Normocephalic and atraumatic.  Right Ear: Hearing, tympanic membrane, external ear and ear canal normal.  Left Ear: Hearing, tympanic membrane, external ear and ear canal normal.  Nose: No mucosal edema or rhinorrhea. Right sinus exhibits maxillary sinus tenderness and frontal sinus tenderness. Left sinus exhibits maxillary sinus tenderness and frontal sinus tenderness.  Mouth/Throat: Uvula is midline, oropharynx is clear and moist and mucous membranes are normal. No oropharyngeal exudate, posterior oropharyngeal edema, posterior oropharyngeal erythema or tonsillar abscesses.  Eyes: Conjunctivae and EOM are normal. Pupils are equal, round, and reactive to light. No scleral icterus.  Neck: Normal range of motion. Neck supple.  Cardiovascular: Normal rate, regular rhythm, normal heart sounds and intact distal pulses.   No murmur heard. Pulmonary/Chest: Effort normal and breath sounds normal. No respiratory distress. She has no wheezes. She has no rales.  Lymphadenopathy:    She has no cervical adenopathy.  Skin: Skin is warm and dry. No rash noted.  Nursing note and vitals reviewed.      Assessment & Plan:   Problem List Items Addressed This Visit    Perennial allergic rhinitis    Mixed congestion/rhinitis. Regular  claritin D use. INS have not been effective. Will trial singulair.      Acute sinusitis - Primary    Given recent orthopedic surgery, treat aggressively with augmentin course. Further supportive care per instructions. Update if not improving with treatment.      Relevant Medications   valACYclovir (VALTREX) 1000 MG tablet   amoxicillin-clavulanate (AUGMENTIN) 875-125 MG tablet        Follow up plan: Return if symptoms worsen or fail to improve.

## 2015-05-01 NOTE — Assessment & Plan Note (Signed)
Mixed congestion/rhinitis. Regular claritin D use. INS have not been effective. Will trial singulair.

## 2015-05-01 NOTE — Progress Notes (Signed)
Pre visit review using our clinic review tool, if applicable. No additional management support is needed unless otherwise documented below in the visit note. 

## 2015-05-01 NOTE — Patient Instructions (Addendum)
You have a sinus infection. Take medicine as prescribed: augmentin 10 d course Push fluids and plenty of rest. Nasal saline irrigation or neti pot to help drain sinuses. May use plain mucinex with plenty of fluid to help mobilize mucous. Please let us know if fever >101.5, trouble opening/closing mouth, difficulty swallowing, or worsening instead of improving as expected.  Trial singulair daily for allergies.

## 2015-08-20 ENCOUNTER — Encounter: Payer: Self-pay | Admitting: Family Medicine

## 2015-08-20 ENCOUNTER — Ambulatory Visit (INDEPENDENT_AMBULATORY_CARE_PROVIDER_SITE_OTHER): Payer: Federal, State, Local not specified - PPO | Admitting: Family Medicine

## 2015-08-20 VITALS — BP 116/70 | HR 80 | Temp 98.3°F | Wt 188.2 lb

## 2015-08-20 DIAGNOSIS — F331 Major depressive disorder, recurrent, moderate: Secondary | ICD-10-CM

## 2015-08-20 DIAGNOSIS — G43109 Migraine with aura, not intractable, without status migrainosus: Secondary | ICD-10-CM | POA: Diagnosis not present

## 2015-08-20 DIAGNOSIS — Z23 Encounter for immunization: Secondary | ICD-10-CM

## 2015-08-20 MED ORDER — VALACYCLOVIR HCL 1 G PO TABS: 1000.0000 mg | ORAL_TABLET | Freq: Two times a day (BID) | ORAL | Status: DC | PRN

## 2015-08-20 MED ORDER — ELETRIPTAN HYDROBROMIDE 40 MG PO TABS: 40.0000 mg | ORAL_TABLET | ORAL | Status: DC | PRN

## 2015-08-20 MED ORDER — IBUPROFEN 800 MG PO TABS: 800.0000 mg | ORAL_TABLET | Freq: Three times a day (TID) | ORAL | Status: DC | PRN

## 2015-08-20 MED ORDER — ASPIRIN-ACETAMINOPHEN-CAFFEINE 250-250-65 MG PO TABS: 1.0000 | ORAL_TABLET | Freq: Four times a day (QID) | ORAL | Status: DC | PRN

## 2015-08-20 MED ORDER — LORATADINE-PSEUDOEPHEDRINE ER 10-240 MG PO TB24: 1.0000 | ORAL_TABLET | Freq: Every day | ORAL | Status: DC

## 2015-08-20 MED ORDER — FLUOXETINE HCL 10 MG PO CAPS: 20.0000 mg | ORAL_CAPSULE | Freq: Every day | ORAL | Status: DC | PRN

## 2015-08-20 NOTE — Progress Notes (Signed)
BP 116/70 mmHg  Pulse 80  Temp(Src) 98.3 F (36.8 C) (Oral)  Wt 188 lb 4 oz (85.39 kg)  LMP 07/30/2015   CC: migraines  Subjective:    Patient ID: Amanda Stout, female    DOB: [birth year], 40 y.o.   MRN: [id number]  HPI: Colena Scheele Stout is a 40 y.o. female presenting on 08/20/2015 for Migraine   Migraines - daily typical migraines for 20 yrs (unilateral headache, nausea, photo/phonophobia). Has seen multiple neurologists in the past. [facility] 1.5 yrs [facility]. Has been tried on multiple daily prophylactic meds without improvement (topamax, elavil, propranolol, depakote). Now just taking imitrex. Triggers are peri-cycle and weather changes and siren lights (new trigger at new office). Planning transfer out of this office. Planning on seeing new neurologist in [place] - doesn't need referral from [place].   17 migraines in January. Migraines last hours to 3-7 days.  Takes imitrex 25-50mg  PRN - unable to tolerate 100mg  dose. Has tried relpax (worked better than imitrex).  Asks about trokendi XR.  Has not tried gabapentin in the past.   Depression - migraine related. On prozac 10-20mg  daily. Self stopped wellbutrin. Saw counselor Kirstie Mirza Alcott. Interested in counselors at our office.  Relevant past medical, surgical, family and social history reviewed and updated as indicated. Interim medical history since our last visit reviewed. Allergies and medications reviewed and updated. Current Outpatient Prescriptions on File Prior to Visit  Medication Sig  . baclofen (LIORESAL) 10 MG tablet Take 1 tablet (10 mg total) by mouth 3 (three) times daily. As needed for muscle spasm  . Prenatal Multivit-Min-Fe-FA (PRE-NATAL PO) Take by mouth.  . SUMAtriptan (IMITREX) 100 MG tablet Take 100 mg by mouth every 2 (two) hours as needed for migraine. May repeat in 2 hours if headache persists or recurs.   No current facility-administered medications on file prior to visit.     Review of Systems Per HPI unless specifically indicated in ROS section     Objective:    BP 116/70 mmHg  Pulse 80  Temp(Src) 98.3 F (36.8 C) (Oral)  Wt 188 lb 4 oz (85.39 kg)  LMP 07/30/2015  Wt Readings from Last 3 Encounters:  08/20/15 188 lb 4 oz (85.39 kg)  05/01/15 183 lb (83.008 kg)  04/10/15 181 lb (82.101 kg)    Physical Exam  Constitutional: She appears well-developed and well-nourished. No distress.  HENT:  Mouth/Throat: Oropharynx is clear and moist. No oropharyngeal exudate.  Eyes: Conjunctivae and EOM are normal. Pupils are equal, round, and reactive to light. No scleral icterus.  EOMI   Neck: Normal range of motion. Neck supple.  Cardiovascular: Normal rate, regular rhythm, normal heart sounds and intact distal pulses.   No murmur heard. Pulmonary/Chest: Effort normal and breath sounds normal. No respiratory distress. She has no wheezes. She has no rales.  Musculoskeletal: She exhibits no edema.  Lymphadenopathy:    She has no cervical adenopathy.  Skin: Skin is warm and dry. No rash noted.  Psychiatric: She has a normal mood and affect.  Nursing note and vitals reviewed.     Assessment & Plan:   Problem List Items Addressed This Visit    Migraine with typical aura - Primary    Worsened this past month with new siren light trigger at work and increased stress - planning on transferring jobs to new building. Requests FMLA form filled out which I will work on. Declines trial gabapentin today - will call to  schedule appt with new neurologist.  Will trial relpax instead of imitrex - sent in. Continue excedrin, ibuporfen. Unable to give toradol shot today - took 600mg  ibuprofen <2 hrs ago. Failed several preventative meds in the past.       Relevant Medications   ibuprofen (ADVIL,MOTRIN) 800 MG tablet   aspirin-acetaminophen-caffeine (EXCEDRIN MIGRAINE) 250-250-65 MG tablet   FLUoxetine (PROZAC) 10 MG capsule   eletriptan (RELPAX) 40 MG tablet   MDD  (major depressive disorder), recurrent episode, moderate (HCC)    Pt self tapered off wellbutrin. Continue prozac 10-20mg  daily. Refilled today. Worse when with migraine.      Relevant Medications   FLUoxetine (PROZAC) 10 MG capsule    Other Visit Diagnoses    Need for influenza vaccination        Relevant Orders    Flu Vaccine QUAD 36+ mos PF IM (Fluarix & Fluzone Quad PF) (Completed)        Follow up plan: Return in about 3 months (around 11/17/2015), or as needed, for follow up visit.

## 2015-08-20 NOTE — Progress Notes (Signed)
Pre visit review using our clinic review tool, if applicable. No additional management support is needed unless otherwise documented below in the visit note. 

## 2015-08-20 NOTE — Assessment & Plan Note (Signed)
Pt self tapered off wellbutrin. Continue prozac 10-20mg  daily. Refilled today. Worse when with migraine.

## 2015-08-20 NOTE — Assessment & Plan Note (Signed)
Worsened this past month with new siren light trigger at work and increased stress - planning on transferring jobs to new building. Requests FMLA form filled out which I will work on. Declines trial gabapentin today - will call to schedule appt with new neurologist.  Will trial relpax instead of imitrex - sent in. Continue excedrin, ibuporfen. Unable to give toradol shot today - took 600mg  ibuprofen <2 hrs ago. Failed several preventative meds in the past.

## 2015-08-20 NOTE — Patient Instructions (Addendum)
Flu shot today Let me know how neurology appt goes. Continue current meds as up to now- refilled today. relpax sent in instead of imitrex.  toradol shot anti inflammatory today for migraine.

## 2015-08-24 ENCOUNTER — Telehealth: Payer: Self-pay | Admitting: Family Medicine

## 2015-08-24 NOTE — Telephone Encounter (Signed)
FMLA forms filled and in [employer].

## 2015-08-24 NOTE — Telephone Encounter (Signed)
Patient notified paperwork is ready to be picked up.

## 2015-09-24 ENCOUNTER — Encounter: Payer: Self-pay | Admitting: Family Medicine

## 2015-09-24 ENCOUNTER — Ambulatory Visit (INDEPENDENT_AMBULATORY_CARE_PROVIDER_SITE_OTHER): Payer: Federal, State, Local not specified - PPO | Admitting: Family Medicine

## 2015-09-24 VITALS — BP 106/68 | HR 76 | Temp 98.4°F | Wt 189.8 lb

## 2015-09-24 DIAGNOSIS — B349 Viral infection, unspecified: Secondary | ICD-10-CM

## 2015-09-24 DIAGNOSIS — B9789 Other viral agents as the cause of diseases classified elsewhere: Secondary | ICD-10-CM

## 2015-09-24 DIAGNOSIS — R509 Fever, unspecified: Secondary | ICD-10-CM

## 2015-09-24 DIAGNOSIS — J988 Other specified respiratory disorders: Secondary | ICD-10-CM

## 2015-09-24 LAB — POCT INFLUENZA A/B
Influenza A, POC: NEGATIVE
Influenza B, POC: NEGATIVE

## 2015-09-24 NOTE — Progress Notes (Signed)
Patient ID: Amanda Stout, female   DOB: [birth year], 40 y.o.   MRN: [id number]  Tommi Rumps, MD Phone: [phone]  Amanda Stout is a 40 y.o. female who presents today for same-day visit.  Patient notes 3 days ago developed fever 102F and body aches with mild frontal headache. Notes sore throat and cough as well with postnasal drip. No trouble breathing. No chest pain. She does note body aches overall. She's been drinking plenty of fluids. Overall she is feeling better today. She does note a contact with documented flu. Her husband now has the same symptoms.  PMH: nonsmoker.   ROS see history of present illness  Objective  Physical Exam Filed Vitals:   09/24/15 1104  BP: 106/68  Pulse: 76  Temp: 98.4 F (36.9 C)    BP Readings from Last 3 Encounters:  09/24/15 106/68  08/20/15 116/70  05/01/15 118/72   Wt Readings from Last 3 Encounters:  09/24/15 189 lb 12.8 oz (86.093 kg)  08/20/15 188 lb 4 oz (85.39 kg)  05/01/15 183 lb (83.008 kg)    Physical Exam  Constitutional: No distress.  HENT:  Head: Normocephalic and atraumatic.  Right Ear: External ear normal.  Left Ear: External ear normal.  Mouth/Throat: Oropharynx is clear and moist. No oropharyngeal exudate.  Normal TMs bilaterally  Eyes: Conjunctivae are normal. Pupils are equal, round, and reactive to light.  Neck: Neck supple.  Cardiovascular: Normal rate, regular rhythm and normal heart sounds.  Exam reveals no gallop and no friction rub.   No murmur heard. Pulmonary/Chest: Effort normal and breath sounds normal. No respiratory distress. She has no wheezes. She has no rales.  Lymphadenopathy:    She has no cervical adenopathy.  Neurological: She is alert. Gait normal.  CN 2-12 intact, 5/5 strength in bilateral biceps, triceps, grip, quads, hamstrings, plantar and dorsiflexion, sensation to light touch intact in bilateral UE and LE, normal gait, 2+ patellar reflexes  Skin: Skin is warm and dry. She is  not diaphoretic.     Assessment/Plan: Please see individual problem list.  Viral respiratory illness Symptoms consistent with viral respiratory illness. Negative rapid flu. Benign exam today. Neurologically intact. Discussed continue to monitor and staying well-hydrated. Tylenol and ibuprofen for discomfort and fever. Patient will continue to monitor. Given work note. Given return precautions.    Orders Placed This Encounter  Procedures  . POCT Influenza A/B    Tommi Rumps, MD [place]

## 2015-09-24 NOTE — Progress Notes (Signed)
Pre visit review using our clinic review tool, if applicable. No additional management support is needed unless otherwise documented below in the visit note. 

## 2015-09-24 NOTE — Patient Instructions (Signed)
Nice to meet you. Your flu test was negative. Her symptoms are likely related to another viral illness. You should continue to monitor your symptoms and if they worsen please let us know. If you develop shortness of breath, recurrent fevers, cough productive of blood, numbness, weakness, vision changes, or any new or changing symptoms please seek medical attention. If those in her household have similar symptoms they should be evaluated.

## 2015-09-24 NOTE — Assessment & Plan Note (Signed)
Symptoms consistent with viral respiratory illness. Negative rapid flu. Benign exam today. Neurologically intact. Discussed continue to monitor and staying well-hydrated. Tylenol and ibuprofen for discomfort and fever. Patient will continue to monitor. Given work note. Given return precautions.

## 2015-09-27 ENCOUNTER — Encounter: Payer: Self-pay | Admitting: Family Medicine

## 2015-11-09 ENCOUNTER — Other Ambulatory Visit: Payer: Self-pay | Admitting: Family Medicine

## 2015-11-26 ENCOUNTER — Ambulatory Visit: Payer: Federal, State, Local not specified - PPO | Admitting: Family Medicine

## 2015-11-26 DIAGNOSIS — Z0289 Encounter for other administrative examinations: Secondary | ICD-10-CM

## 2015-12-02 ENCOUNTER — Ambulatory Visit: Payer: Federal, State, Local not specified - PPO | Admitting: Psychology

## 2015-12-18 ENCOUNTER — Ambulatory Visit (INDEPENDENT_AMBULATORY_CARE_PROVIDER_SITE_OTHER): Payer: Federal, State, Local not specified - PPO | Admitting: Psychology

## 2015-12-18 DIAGNOSIS — F4323 Adjustment disorder with mixed anxiety and depressed mood: Secondary | ICD-10-CM

## 2015-12-29 ENCOUNTER — Other Ambulatory Visit: Payer: Self-pay | Admitting: Family Medicine

## 2015-12-29 NOTE — Telephone Encounter (Signed)
Last filled on 08/20/15 #10 tabs with 3 additional refills, please advise

## 2015-12-30 ENCOUNTER — Ambulatory Visit: Payer: Federal, State, Local not specified - PPO | Admitting: Psychology

## 2016-01-01 ENCOUNTER — Ambulatory Visit: Payer: Federal, State, Local not specified - PPO | Admitting: Psychology

## 2016-01-21 ENCOUNTER — Ambulatory Visit (INDEPENDENT_AMBULATORY_CARE_PROVIDER_SITE_OTHER): Payer: Federal, State, Local not specified - PPO | Admitting: Internal Medicine

## 2016-01-21 ENCOUNTER — Encounter: Payer: Self-pay | Admitting: Internal Medicine

## 2016-01-21 VITALS — BP 112/80 | HR 80 | Temp 98.4°F | Wt 192.0 lb

## 2016-01-21 DIAGNOSIS — M791 Myalgia, unspecified site: Secondary | ICD-10-CM | POA: Insufficient documentation

## 2016-01-21 NOTE — Assessment & Plan Note (Signed)
Vague fatigue and mild systemic symptoms More suggestive of mosquito borne viral infection No evidence of RMSF or Lyme Discussed worry symptoms--- fever, true arthritis, etc Supportive care--continue ibuprofen

## 2016-01-21 NOTE — Progress Notes (Signed)
Pre visit review using our clinic review tool, if applicable. No additional management support is needed unless otherwise documented below in the visit note. 

## 2016-01-21 NOTE — Progress Notes (Signed)
Subjective:    Patient ID: Amanda Stout, female    DOB: [birth year], 40 y.o.   MRN: [id number]  HPI Here due to fatigue and fever  About 2 weeks ago, felt something on her wrist after walking dogs (was in bed). Pulled it off and noted a mark there  Since then , having shoulder aching Bad with any use Some headache Feels tired all the time-- not sure if completely new (might be washed out from heat)  Hot and cold spells ~4 days ago No clear fever  No cough or SOB No rash  Current Outpatient Prescriptions on File Prior to Visit  Medication Sig Dispense Refill  . aspirin-acetaminophen-caffeine (EXCEDRIN MIGRAINE) 250-250-65 MG tablet Take 1 tablet by mouth every 6 (six) hours as needed for headache. 30 tablet 11  . baclofen (LIORESAL) 10 MG tablet Take 1 tablet (10 mg total) by mouth 3 (three) times daily. As needed for muscle spasm 50 tablet 0  . FLUoxetine (PROZAC) 10 MG capsule Take 2 capsules (20 mg total) by mouth daily as needed. 180 capsule 3  . ibuprofen (ADVIL,MOTRIN) 800 MG tablet Take 1 tablet (800 mg total) by mouth every 8 (eight) hours as needed. 30 tablet 11  . loratadine-pseudoephedrine (CLARITIN-D 24 HOUR) 10-240 MG 24 hr tablet Take 1 tablet by mouth daily. 30 tablet 11  . Prenatal Multivit-Min-Fe-FA (PRE-NATAL PO) Take by mouth.    . RELPAX 40 MG tablet TAKE 1 TABLET BY MOUTH AS NEEDED FOR MIGRAINE OR HEADACHE MAY REPEAT IN 2 HOURS IF HEADACHE PERSISTS 5 tablet 3  . SUMAtriptan (IMITREX) 100 MG tablet TAKE 1 TABLET BY MOUTH EVERY 2 HOURS AS NEEDED MIGRAINE, MAY REPEAT IN 2 HOURS IF HEADACHE PERSISTS 10 tablet 6  . valACYclovir (VALTREX) 1000 MG tablet Take 1 tablet (1,000 mg total) by mouth 2 (two) times daily as needed. 30 tablet 3   No current facility-administered medications on file prior to visit.    Allergies  Allergen Reactions  . Lactose Intolerance (Gi) Other (See Comments)    GI UPSET    Past Medical History  Diagnosis Date  . Lower back pain  2010    s/p herniated disc  . Chronic migraine   . Arthritis     L4-5  . ACL injury tear 04/2015    right  . Perennial allergic rhinitis     Past Surgical History  Procedure Laterality Date  . Wisdom tooth extraction  1998  . Knee arthroscopy with anterior cruciate ligament (acl) repair Right 04/10/2015    Marchia Bond, MD;  [facility]    Family History  Problem Relation Age of Onset  . Cancer Mother 47    uterine  . Hyperlipidemia Father   . Hypertension Father   . Stroke Maternal Grandmother   . CAD Maternal Grandfather 49    MI  . Hypertension Maternal Grandfather   . CAD Paternal Grandfather 22    MI  . Hypertension Paternal Grandfather     Social History   Social History  . Marital Status: Single    Spouse Name: N/A  . Number of Children: N/A  . Years of Education: N/A   Occupational History  . Not on file.   Social History Main Topics  . Smoking status: Never Smoker   . Smokeless tobacco: Never Used  . Alcohol Use: Yes     Comment: 2 x/week  . Drug Use: No  . Sexual Activity: Not Currently   Other Topics  Concern  . Not on file   Social History Narrative   Lives alone, 1 dog and 3 cats   Occupation: Passenger transport manager for [employer]   Edu: BS   Activity: walking dog, more intense exercises hurts back   Diet: good water, fruits/vegetables daily   Review of Systems Appetite is off a little Slight nausea--no vomiting Weight stable Some cold sweats at night--since the shoulder aching started    Objective:   Physical Exam  Constitutional: She appears well-developed and well-nourished. No distress.  HENT:  Mouth/Throat: Oropharynx is clear and moist. No oropharyngeal exudate.  Neck: Normal range of motion. Neck supple.  Cardiovascular: Normal rate, regular rhythm and normal heart sounds.  Exam reveals no gallop.   No murmur heard. Pulmonary/Chest: Effort normal and breath sounds normal. No respiratory distress. She has no wheezes. She  has no rales.  Abdominal: Soft. There is no tenderness.  Musculoskeletal: She exhibits no edema or tenderness.  No active synovitis  Lymphadenopathy:    She has no cervical adenopathy.  Skin:  Very slight red spot on right volar wrist          Assessment & Plan:

## 2016-02-11 ENCOUNTER — Encounter: Payer: Self-pay | Admitting: Family Medicine

## 2016-02-11 ENCOUNTER — Encounter: Payer: Self-pay | Admitting: *Deleted

## 2016-02-11 ENCOUNTER — Ambulatory Visit (INDEPENDENT_AMBULATORY_CARE_PROVIDER_SITE_OTHER): Payer: Federal, State, Local not specified - PPO | Admitting: Family Medicine

## 2016-02-11 VITALS — BP 112/70 | HR 74 | Temp 98.9°F | Wt 190.8 lb

## 2016-02-11 DIAGNOSIS — F410 Panic disorder [episodic paroxysmal anxiety] without agoraphobia: Secondary | ICD-10-CM | POA: Diagnosis not present

## 2016-02-11 DIAGNOSIS — F331 Major depressive disorder, recurrent, moderate: Secondary | ICD-10-CM

## 2016-02-11 MED ORDER — HYDROXYZINE HCL 25 MG PO TABS: 12.5000 mg | ORAL_TABLET | Freq: Four times a day (QID) | ORAL | 1 refills | Status: DC | PRN

## 2016-02-11 MED ORDER — DULOXETINE HCL 30 MG PO CPEP: ORAL_CAPSULE | ORAL | 2 refills | Status: DC

## 2016-02-11 NOTE — Patient Instructions (Addendum)
Please walk daily, eat healthy foods, stick to regular sleep schedule If you have worsening symptoms, please call or go to ER Duloxetine delayed-release capsules What is this medicine? DULOXETINE (doo LOX e teen) is used to treat depression, anxiety, and different types of chronic pain. This medicine may be used for other purposes; ask your health care provider or pharmacist if you have questions. What should I tell my health care provider before I take this medicine? They need to know if you have any of these conditions: -bipolar disorder or a family history of bipolar disorder -glaucoma -kidney disease -liver disease -suicidal thoughts or a previous suicide attempt -taken medicines called MAOIs like Carbex, Eldepryl, Marplan, Nardil, and Parnate within 14 days -an unusual reaction to duloxetine, other medicines, foods, dyes, or preservatives -pregnant or trying to get pregnant -breast-feeding How should I use this medicine? Take this medicine by mouth with a glass of water. Follow the directions on the prescription label. Do not cut, crush or chew this medicine. You can take this medicine with or without food. Take your medicine at regular intervals. Do not take your medicine more often than directed. Do not stop taking this medicine suddenly except upon the advice of your doctor. Stopping this medicine too quickly may cause serious side effects or your condition may worsen. A special MedGuide will be given to you by the pharmacist with each prescription and refill. Be sure to read this information carefully each time. Talk to your pediatrician regarding the use of this medicine in children. While this drug may be prescribed for children as young as 1 years of age for selected conditions, precautions do apply. Overdosage: If you think you have taken too much of this medicine contact a poison control center or emergency room at once. NOTE: This medicine is only for you. Do not share this  medicine with others. What if I miss a dose? If you miss a dose, take it as soon as you can. If it is almost time for your next dose, take only that dose. Do not take double or extra doses. What may interact with this medicine? Do not take this medicine with any of the following medications: -certain diet drugs like dexfenfluramine, fenfluramine -desvenlafaxine -linezolid -MAOIs like Azilect, Carbex, Eldepryl, Marplan, Nardil, and Parnate -methylene blue (intravenous) -milnacipran -thioridazine -venlafaxine This medicine may also interact with the following medications: -alcohol -aspirin and aspirin-like medicines -certain antibiotics like ciprofloxacin and enoxacin -certain medicines for blood pressure, heart disease, irregular heart beat -certain medicines for depression, anxiety, or psychotic disturbances -certain medicines for migraine headache like almotriptan, eletriptan, frovatriptan, naratriptan, rizatriptan, sumatriptan, zolmitriptan -certain medicines that treat or prevent blood clots like warfarin, enoxaparin, and dalteparin -cimetidine -fentanyl -lithium -NSAIDS, medicines for pain and inflammation, like ibuprofen or naproxen -phentermine -procarbazine -sibutramine -St. John's wort -theophylline -tramadol -tryptophan This list may not describe all possible interactions. Give your health care provider a list of all the medicines, herbs, non-prescription drugs, or dietary supplements you use. Also tell them if you smoke, drink alcohol, or use illegal drugs. Some items may interact with your medicine. What should I watch for while using this medicine? Tell your doctor if your symptoms do not get better or if they get worse. Visit your doctor or health care professional for regular checks on your progress. Because it may take several weeks to see the full effects of this medicine, it is important to continue your treatment as prescribed by your doctor. Patients and their  families should  watch out for new or worsening thoughts of suicide or depression. Also watch out for sudden changes in feelings such as feeling anxious, agitated, panicky, irritable, hostile, aggressive, impulsive, severely restless, overly excited and hyperactive, or not being able to sleep. If this happens, especially at the beginning of treatment or after a change in dose, call your health care professional. Dennis Bast may get drowsy or dizzy. Do not drive, use machinery, or do anything that needs mental alertness until you know how this medicine affects you. Do not stand or sit up quickly, especially if you are an older patient. This reduces the risk of dizzy or fainting spells. Alcohol may interfere with the effect of this medicine. Avoid alcoholic drinks. This medicine can cause an increase in blood pressure. This medicine can also cause a sudden drop in your blood pressure, which may make you feel faint and increase the chance of a fall. These effects are most common when you first start the medicine or when the dose is increased, or during use of other medicines that can cause a sudden drop in blood pressure. Check with your doctor for instructions on monitoring your blood pressure while taking this medicine. Your mouth may get dry. Chewing sugarless gum or sucking hard candy, and drinking plenty of water may help. Contact your doctor if the problem does not go away or is severe. What side effects may I notice from receiving this medicine? Side effects that you should report to your doctor or health care professional as soon as possible: -allergic reactions like skin rash, itching or hives, swelling of the face, lips, or tongue -changes in blood pressure -confusion -dark urine -dizziness -fast talking and excited feelings or actions that are out of control -fast, irregular heartbeat -fever -general ill feeling or flu-like symptoms -hallucination, loss of contact with reality -light-colored  stools -loss of balance or coordination -redness, blistering, peeling or loosening of the skin, including inside the mouth -right upper belly pain -seizures -suicidal thoughts or other mood changes -trouble concentrating -trouble passing urine or change in the amount of urine -unusual bleeding or bruising -unusually weak or tired -yellowing of the eyes or skin Side effects that usually do not require medical attention (report to your doctor or health care professional if they continue or are bothersome): -blurred vision -change in appetite -change in sex drive or performance -headache -increased sweating -nausea This list may not describe all possible side effects. Call your doctor for medical advice about side effects. You may report side effects to FDA at 1-800-FDA-1088. Where should I keep my medicine? Keep out of the reach of children. Store at room temperature between 20 and 25 degrees C (68 to 77 degrees F). Throw away any unused medicine after the expiration date. NOTE: This sheet is a summary. It may not cover all possible information. If you have questions about this medicine, talk to your doctor, pharmacist, or health care provider.    2016, Elsevier/Gold Standard. (2013-06-11 [id number])

## 2016-02-11 NOTE — Progress Notes (Signed)
Pre visit review using our clinic review tool, if applicable. No additional management support is needed unless otherwise documented below in the visit note. 

## 2016-02-11 NOTE — Progress Notes (Signed)
Subjective:    Patient ID: Amanda Stout, female    DOB: October 16, 1975, 40 y.o.   MRN: [id number]  HPI This is a 40 yo female who presents today with increased depression and panic attacks. She was married 10 months ago and has a lot of stress in her marriage. Her husband was unemployed and has depression and ADHD. She has a 27 yo step son and there are ongoing legal issues that have led to increased stress and financial concerns. 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 She has started to have panic attacks which she has never had in the past. She has racing heart and desire to flee the situation. Lasts 5-10 minutes. Occur during stressful times. Sleeping more than usual lately. Was on fluoxetine 20 mg for about a year and a half. Ran out about 1 week ago. Though it helped some. Feels achy. Her two cats died unexpectedly while she was out of town and that has affected her deeply. She and her husband have been trying unsucessfully to get get pregnant since getting married. She is off her OCPs which helped regulate her mood. She has thought of "not wanting to go on." No previous suicide attempts, no plan. She has had therapy with Pervis Hocking in the past which she found helpful but is concerned about access and is willing to see someone different. She would like to try a different medicine than fluoxetine and is concerned about weight gain.    Past Medical History:  Diagnosis Date  . ACL injury tear 04/2015   right  . Arthritis    L4-5  . Chronic migraine   . Lower back pain 2010   s/p herniated disc  . Perennial allergic rhinitis    Past Surgical History:  Procedure Laterality Date  . KNEE ARTHROSCOPY WITH ANTERIOR CRUCIATE LIGAMENT (ACL) REPAIR Right 04/10/2015   Marchia Bond, MD;  [facility]  . WISDOM TOOTH EXTRACTION  1998   Family History  Problem Relation Age of Onset  . Cancer Mother 58    uterine  . Hyperlipidemia Father     . Hypertension Father   . Stroke Maternal Grandmother   . CAD Maternal Grandfather 73    MI  . Hypertension Maternal Grandfather   . CAD Paternal Grandfather 53    MI  . Hypertension Paternal Grandfather    Social History  Substance Use Topics  . Smoking status: Never Smoker  . Smokeless tobacco: Never Used  . Alcohol use Yes     Comment: 2 x/week      Review of Systems Per HPI    Objective:   Physical Exam  Constitutional: She is oriented to person, place, and time. She appears well-developed and well-nourished.  HENT:  Head: Normocephalic and atraumatic.  Cardiovascular: Normal rate.   Pulmonary/Chest: Effort normal.  Neurological: She is alert and oriented to person, place, and time.  Skin: Skin is warm and dry.  Psychiatric: She has a normal mood and affect. Her behavior is normal. Judgment and thought content normal.  Tearful at times during interview.   Vitals reviewed.     BP 112/70 (BP Location: Left Arm, Patient Position: Sitting, Cuff Size: Normal)   Pulse 74   Temp 98.9 F (37.2 C) (Oral)   Wt 190 lb 12 oz (86.5 kg)   LMP 01/24/2016   SpO2 98%   BMI 30.79 kg/m  Wt Readings from Last 3 Encounters:  02/11/16 190 lb 12 oz (86.5 kg)  01/21/16 192 lb (87.1 kg)  09/24/15 189 lb 12.8 oz (86.1 kg)       Assessment & Plan:  1. MDD (major depressive disorder), recurrent episode, moderate (HCC) - DULoxetine (CYMBALTA) 30 MG capsule; Take one tablet daily for 1 week, then increase to 1 tablet twice a day  Dispense: 60 capsule; Refill: 2 - Ambulatory referral to Psychology  2. Panic attack - DULoxetine (CYMBALTA) 30 MG capsule; Take one tablet daily for 1 week, then increase to 1 tablet twice a day  Dispense: 60 capsule; Refill: 2 - Ambulatory referral to Psychology - follow up in 3 weeks, sooner if worsening symptoms  Clarene Reamer, FNP-BC  [facility], [place]  02/13/2016 12:49 PM

## 2016-02-12 ENCOUNTER — Telehealth: Payer: Self-pay

## 2016-02-12 MED ORDER — LORAZEPAM 0.5 MG PO TABS: 0.2500 mg | ORAL_TABLET | Freq: Two times a day (BID) | ORAL | 0 refills | Status: DC | PRN

## 2016-02-12 NOTE — Telephone Encounter (Signed)
Spoke with pt. Ongoing anxiety attacks with racing heart, anxiety, tearfulness with stressors.  Reliable patient. Ok to try lorazepam PRN. Discussed temporary use while cymbalta kicks in. Discussed controlled substance nature of medication. Discussed addiction, tolerance, dependence potential. Discussed don't take with alcohol, don't take and drive. Pt will f/u with myself or with Debbie in next few weeks.  Pt agrees with plan.  Discussed will need to stop lorazepam if pregnancy.  Lorazepam phoned in.

## 2016-02-12 NOTE — Telephone Encounter (Signed)
Pt left v/m; pt saw Ms Carlean Purl NP on 02/11/16 and was given Hydroxyzine for anxiety and panic attack. Pt has taken 2 pills so far but pt has not had any benefit from med and request different med sent to [facility] before the weekend. Pt request cb. Ms Carlean Purl is not in office today. Will send note to Dr Danise Mina.

## 2016-02-29 ENCOUNTER — Ambulatory Visit: Payer: Self-pay | Admitting: Family Medicine

## 2016-03-09 ENCOUNTER — Ambulatory Visit (INDEPENDENT_AMBULATORY_CARE_PROVIDER_SITE_OTHER): Payer: Federal, State, Local not specified - PPO | Admitting: Family Medicine

## 2016-03-09 ENCOUNTER — Encounter: Payer: Self-pay | Admitting: Family Medicine

## 2016-03-09 VITALS — BP 114/70 | HR 80 | Temp 98.7°F | Wt 197.0 lb

## 2016-03-09 DIAGNOSIS — F410 Panic disorder [episodic paroxysmal anxiety] without agoraphobia: Secondary | ICD-10-CM

## 2016-03-09 DIAGNOSIS — Z23 Encounter for immunization: Secondary | ICD-10-CM | POA: Diagnosis not present

## 2016-03-09 DIAGNOSIS — G43109 Migraine with aura, not intractable, without status migrainosus: Secondary | ICD-10-CM | POA: Diagnosis not present

## 2016-03-09 DIAGNOSIS — F331 Major depressive disorder, recurrent, moderate: Secondary | ICD-10-CM

## 2016-03-09 MED ORDER — DULOXETINE HCL 60 MG PO CPEP: 60.0000 mg | ORAL_CAPSULE | Freq: Every day | ORAL | 2 refills | Status: DC

## 2016-03-09 MED ORDER — SUMATRIPTAN SUCCINATE 100 MG PO TABS: ORAL_TABLET | ORAL | 6 refills | Status: DC

## 2016-03-09 MED ORDER — DULOXETINE HCL 60 MG PO CPEP: 60.0000 mg | ORAL_CAPSULE | Freq: Every day | ORAL | 1 refills | Status: DC

## 2016-03-09 MED ORDER — ELETRIPTAN HYDROBROMIDE 40 MG PO TABS: ORAL_TABLET | ORAL | 3 refills | Status: DC

## 2016-03-09 MED ORDER — LORAZEPAM 0.5 MG PO TABS: 0.2500 mg | ORAL_TABLET | Freq: Two times a day (BID) | ORAL | 0 refills | Status: DC | PRN

## 2016-03-09 NOTE — Assessment & Plan Note (Addendum)
Trying to get counseling set up with extended hours. Continue cymbalta 60mg  daily. See below.

## 2016-03-09 NOTE — Progress Notes (Signed)
Pre visit review using our clinic review tool, if applicable. No additional management support is needed unless otherwise documented below in the visit note. 

## 2016-03-09 NOTE — Assessment & Plan Note (Signed)
Discussed with patient. cymbalta 30mg  bid helpful but ongoing trouble with attacks. Discussed options - adjuvant med vs change therapy to celexa/lexapro. Pt desires to continue 60mg  daily and treat for another month to see if effectiveness improves, will refill lorazepam as well. Will work towards establishing with counselor with extended hours.

## 2016-03-09 NOTE — Progress Notes (Signed)
BP 114/70   Pulse 80   Temp 98.7 F (37.1 C) (Oral)   Wt 197 lb (89.4 kg)   LMP 02/17/2016   BMI 31.80 kg/m    CC: med refill visit Subjective:    Patient ID: Amanda Stout, female    DOB: 13-Apr-1976, 40 y.o.   MRN: [id number]  HPI: Amanda Stout is a 40 y.o. female presenting on 03/09/2016 for Medication Refill   Seen last month by Tor Netters with worsening depression with panic attacks due to family stressors and work stressors. Has previously seen counselors, latest Dr Rexene Edison at our office. Looking for counselor in [place] with more extended hours. Cymbalta 30mg  BID was started last month. Hydroxyzine did not work so lorazepam was started temporarily.   Finds trouble handling confrontation, sudden mood swings. Lorazepam has helped. Anxiety attacks associated with palpitations and dyspnea. Triggers are confrontations. prozac may have been more effective, but caused weight gain.   Migraines - followed by neurology. On baclofen, excedrin, relpax.   Relevant past medical, surgical, family and social history reviewed and updated as indicated. Interim medical history since our last visit reviewed. Allergies and medications reviewed and updated. Current Outpatient Prescriptions on File Prior to Visit  Medication Sig  . aspirin-acetaminophen-caffeine (EXCEDRIN MIGRAINE) 250-250-65 MG tablet Take 1 tablet by mouth every 6 (six) hours as needed for headache.  . baclofen (LIORESAL) 10 MG tablet Take 1 tablet (10 mg total) by mouth 3 (three) times daily. As needed for muscle spasm  . ibuprofen (ADVIL,MOTRIN) 800 MG tablet Take 1 tablet (800 mg total) by mouth every 8 (eight) hours as needed.  . loratadine-pseudoephedrine (CLARITIN-D 24 HOUR) 10-240 MG 24 hr tablet Take 1 tablet by mouth daily.  . Prenatal Multivit-Min-Fe-FA (PRE-NATAL PO) Take by mouth.  . valACYclovir (VALTREX) 1000 MG tablet Take 1 tablet (1,000 mg total) by mouth 2 (two) times daily as needed.   No current  facility-administered medications on file prior to visit.     Review of Systems Per HPI unless specifically indicated in ROS section     Objective:    BP 114/70   Pulse 80   Temp 98.7 F (37.1 C) (Oral)   Wt 197 lb (89.4 kg)   LMP 02/17/2016   BMI 31.80 kg/m   Wt Readings from Last 3 Encounters:  03/09/16 197 lb (89.4 kg)  02/11/16 190 lb 12 oz (86.5 kg)  01/21/16 192 lb (87.1 kg)    Physical Exam  Constitutional: She appears well-developed and well-nourished. No distress.  Psychiatric: She has a normal mood and affect. Her behavior is normal.  Nursing note and vitals reviewed.     Assessment & Plan:  Over 25 minutes were spent face-to-face with the patient during this encounter and >50% of that time was spent on counseling and coordination of care  Problem List Items Addressed This Visit    Anxiety attack    Discussed with patient. cymbalta 30mg  bid helpful but ongoing trouble with attacks. Discussed options - adjuvant med vs change therapy to celexa/lexapro. Pt desires to continue 60mg  daily and treat for another month to see if effectiveness improves, will refill lorazepam as well. Will work towards establishing with counselor with extended hours.       Relevant Medications   LORazepam (ATIVAN) 0.5 MG tablet   DULoxetine (CYMBALTA) 60 MG capsule   MDD (major depressive disorder), recurrent episode, moderate ([place]) - Primary    Trying to get counseling set up with extended hours. Continue  cymbalta 60mg  daily. See below.       Relevant Medications   LORazepam (ATIVAN) 0.5 MG tablet   DULoxetine (CYMBALTA) 60 MG capsule   Migraine with typical aura    Requests triptan refill. Only gets 4 relpax /month so supplements with imitrex.       Relevant Medications   eletriptan (RELPAX) 40 MG tablet   SUMAtriptan (IMITREX) 100 MG tablet   DULoxetine (CYMBALTA) 60 MG capsule    Other Visit Diagnoses    Need for influenza vaccination       Relevant Orders   Flu Vaccine  QUAD 36+ mos PF IM (Fluarix & Fluzone Quad PF) (Completed)       Follow up plan: Return in about 6 weeks (around 04/20/2016), or as needed, for follow up visit.  Ria Bush, MD

## 2016-03-09 NOTE — Patient Instructions (Addendum)
Flu shot today Touch base with Rosaria Ferries about counselor in [place] with extended hours.  Let's increase cymbalta to 60mg  once daily. Give this another few weeks to see full effect. May use lorazepam as needed for anxiety attack. Return in 1-2 months for follow up visit.  Triptans refilled today.

## 2016-03-09 NOTE — Assessment & Plan Note (Signed)
Requests triptan refill. Only gets 4 relpax /month so supplements with imitrex.

## 2016-04-11 ENCOUNTER — Ambulatory Visit (INDEPENDENT_AMBULATORY_CARE_PROVIDER_SITE_OTHER): Payer: Federal, State, Local not specified - PPO | Admitting: Internal Medicine

## 2016-04-11 ENCOUNTER — Encounter: Payer: Self-pay | Admitting: Internal Medicine

## 2016-04-11 VITALS — BP 110/70 | HR 68 | Temp 99.0°F | Wt 193.0 lb

## 2016-04-11 DIAGNOSIS — R51 Headache: Secondary | ICD-10-CM

## 2016-04-11 DIAGNOSIS — R140 Abdominal distension (gaseous): Secondary | ICD-10-CM

## 2016-04-11 DIAGNOSIS — R42 Dizziness and giddiness: Secondary | ICD-10-CM | POA: Diagnosis not present

## 2016-04-11 DIAGNOSIS — E860 Dehydration: Secondary | ICD-10-CM

## 2016-04-11 DIAGNOSIS — R110 Nausea: Secondary | ICD-10-CM

## 2016-04-11 DIAGNOSIS — R519 Headache, unspecified: Secondary | ICD-10-CM

## 2016-04-11 LAB — COMPREHENSIVE METABOLIC PANEL
ALT: 14 U/L (ref 0–35)
AST: 18 U/L (ref 0–37)
Albumin: 4.2 g/dL (ref 3.5–5.2)
Alkaline Phosphatase: 68 U/L (ref 39–117)
BILIRUBIN TOTAL: 0.3 mg/dL (ref 0.2–1.2)
BUN: 11 mg/dL (ref 6–23)
CHLORIDE: 103 meq/L (ref 96–112)
CO2: 29 meq/L (ref 19–32)
Calcium: 9.6 mg/dL (ref 8.4–10.5)
Creatinine, Ser: 0.86 mg/dL (ref 0.40–1.20)
GFR: 78.54 mL/min (ref >60.00)
GLUCOSE: 92 mg/dL (ref 70–99)
Potassium: 4 mEq/L (ref 3.5–5.1)
Sodium: 138 mEq/L (ref 135–145)
Total Protein: 7.4 g/dL (ref 6.0–8.3)

## 2016-04-11 LAB — CBC
HCT: 38.3 % (ref 36.0–46.0)
HEMOGLOBIN: 12.9 g/dL (ref 12.0–15.0)
MCHC: 33.6 g/dL (ref 30.0–36.0)
MCV: 91.3 fl (ref 78.0–100.0)
Platelets: 281 10*3/uL (ref 150.0–400.0)
RBC: 4.2 Mil/uL (ref 3.87–5.11)
RDW: 12.7 % (ref 11.5–15.5)
WBC: 6 10*3/uL (ref 4.0–10.5)

## 2016-04-11 MED ORDER — MECLIZINE HCL 25 MG PO TABS: 25.0000 mg | ORAL_TABLET | Freq: Three times a day (TID) | ORAL | 0 refills | Status: DC | PRN

## 2016-04-11 NOTE — Patient Instructions (Signed)

## 2016-04-11 NOTE — Progress Notes (Signed)
Subjective:    Patient ID: Amanda Stout, female    DOB: 1976/01/26, 40 y.o.   MRN: [id number]  HPI   Pt presents to the clinic today with c/o vague GI symptoms which started last Thursday after coming back [place], which include abdominal bloating, one bout of diarrhea, extreme dizziness, headache. Her dizziness is exacerbated by eating and drinking fluids in which she has reported moderate intake with minimal urination since Friday. She denies vomiting and fevers. Denies focal abdominal pain. She reports that she has not had a bowel movement since Friday. She did [place] medication in the [place] for possible parasitic infection but has not taken anything since returning home. She reports she did drink the tap water while in the [place]. LMP was 04/07/2016. Her husband, also on the vacation, has no similar s/s.     Review of Systems  Past Medical History:  Diagnosis Date  . ACL injury tear 04/2015   right  . Arthritis    L4-5  . Chronic migraine   . Lower back pain 2010   s/p herniated disc  . Perennial allergic rhinitis     Current Outpatient Prescriptions  Medication Sig Dispense Refill  . aspirin-acetaminophen-caffeine (EXCEDRIN MIGRAINE) 250-250-65 MG tablet Take 1 tablet by mouth every 6 (six) hours as needed for headache. 30 tablet 11  . baclofen (LIORESAL) 10 MG tablet Take 1 tablet (10 mg total) by mouth 3 (three) times daily. As needed for muscle spasm 50 tablet 0  . DULoxetine (CYMBALTA) 60 MG capsule Take 1 capsule (60 mg total) by mouth daily. 30 capsule 2  . eletriptan (RELPAX) 40 MG tablet TAKE 1 TABLET BY MOUTH AS NEEDED FOR MIGRAINE OR HEADACHE MAY REPEAT IN 2 HOURS IF HEADACHE PERSISTS 5 tablet 3  . ibuprofen (ADVIL,MOTRIN) 800 MG tablet Take 1 tablet (800 mg total) by mouth every 8 (eight) hours as needed. 30 tablet 11  . loratadine-pseudoephedrine (CLARITIN-D 24 HOUR) 10-240 MG 24 hr tablet Take 1 tablet by mouth daily. 30 tablet 11  . LORazepam  (ATIVAN) 0.5 MG tablet Take 0.5-1 tablets (0.25-0.5 mg total) by mouth 2 (two) times daily as needed for anxiety. 30 tablet 0  . Prenatal Multivit-Min-Fe-FA (PRE-NATAL PO) Take by mouth.    . SUMAtriptan (IMITREX) 100 MG tablet TAKE 1 TABLET BY MOUTH EVERY 2 HOURS AS NEEDED MIGRAINE, MAY REPEAT IN 2 HOURS IF HEADACHE PERSISTS 10 tablet 6  . valACYclovir (VALTREX) 1000 MG tablet Take 1 tablet (1,000 mg total) by mouth 2 (two) times daily as needed. 30 tablet 3  . meclizine (ANTIVERT) 25 MG tablet Take 1 tablet (25 mg total) by mouth 3 (three) times daily as needed for dizziness. 30 tablet 0   No current facility-administered medications for this visit.     Allergies  Allergen Reactions  . Lactose Intolerance (Gi) Other (See Comments)    GI UPSET    Family History  Problem Relation Age of Onset  . Cancer Mother 44    uterine  . Hyperlipidemia Father   . Hypertension Father   . Stroke Maternal Grandmother   . CAD Maternal Grandfather 70    MI  . Hypertension Maternal Grandfather   . CAD Paternal Grandfather 57    MI  . Hypertension Paternal Grandfather     Social History   Social History  . Marital status: Single    Spouse name: N/A  . Number of children: N/A  . Years of education:  N/A   Occupational History  . Not on file.   Social History Main Topics  . Smoking status: Never Smoker  . Smokeless tobacco: Never Used  . Alcohol use Yes     Comment: 2 x/week  . Drug use: No  . Sexual activity: Not Currently   Other Topics Concern  . Not on file   Social History Narrative   Married 2016, has step son, 1 dog and 1 cat   Occupation: Passenger transport manager for [employer]   Edu: BS   Activity: walking dog, more intense exercises hurts back   Diet: good water, fruits/vegetables daily     Constitutional: Pt reports headache and fatigue. Denies fevers or abrupt weight changes.  HEENT: Denies eye pain, eye redness, ear pain, ringing in the ears, wax buildup, runny nose, nasal  congestion, bloody nose, or sore throat. Respiratory: Denies difficulty breathing, shortness of breath, cough or sputum production.   Cardiovascular: Denies chest pain, chest tightness, palpitations or swelling in the hands or feet.  Gastrointestinal: Pt reports bloating, constipation, and one episode of diarrhea. Denies  blood in the stool or emesis.  GU: Denies urgency, frequency, pain with urination, burning sensation, blood in urine, odor or discharge. Skin: Denies redness, rashes, lesions or ulcercations.  Neurological: Pt reports dizziness and intermittent problems with balance and coordination. Denies difficulty with memory or difficulty with speech.  No other specific complaints in a complete review of systems (except as listed in HPI above).     Objective:   Physical Exam  BP 110/70   Pulse 68   Temp 99 F (37.2 C) (Oral)   Wt 193 lb (87.5 kg)   LMP 04/08/2016   BMI 31.15 kg/m  Wt Readings from Last 3 Encounters:  04/11/16 193 lb (87.5 kg)  03/09/16 197 lb (89.4 kg)  02/11/16 190 lb 12 oz (86.5 kg)    General: Pale, in NAD.  Skin:  No rashes, lesions or ulcerations noted. HEENT: Head: normal shape and size; Eyes: sunken, sclera white, no icterus, conjunctiva pink, PERRLA and EOMs intact; Ears: Tm's gray and intact, normal light reflex; Throat/Mouth: Teeth present, mucosa pink and dry, no exudate, lesions or ulcerations noted.  Neck:  No adenopathy.  Cardiovascular: Normal rate and rhythm. S1,S2 noted.  No murmur, rubs or gallops noted.  Pulmonary/Chest: Normal effort and positive vesicular breath sounds. No respiratory distress. No wheezes, rales or ronchi noted.  Abdomen: Soft with left upper and lower abdominal tenderness noted. Normal bowel sounds. No distention or masses noted. Liver, spleen and kidneys non palpable. Neurological: Alert and oriented.  Coordination normal.    BMET    Component Value Date/Time   NA 136 10/31/2014 1059   K 4.0 10/31/2014 1059   CL  103 10/31/2014 1059   CO2 27 10/31/2014 1059   GLUCOSE 77 10/31/2014 1059   BUN 7 10/31/2014 1059   CREATININE 0.86 10/31/2014 1059   CALCIUM 9.7 10/31/2014 1059    Lipid Panel     Component Value Date/Time   CHOL 149 10/31/2014 1059   TRIG 57.0 10/31/2014 1059   HDL 45.10 10/31/2014 1059   CHOLHDL 3 10/31/2014 1059   VLDL 11.4 10/31/2014 1059   LDLCALC 93 10/31/2014 1059    CBC    Component Value Date/Time   HGB 13.2 04/10/2015 0647    Hgb A1C No results found for: HGBA1C            Assessment & Plan:   Abdominal bloating, nausea, dizziness, headache  and dehydration  eRx for Meclizine for nausea and dizziness Encouraged to increase fluid intake Will need to complete a Fleets enema OTC to encourage a bowel movement Will check CBC, CMET today  Will call with results and reassess pt at that time. Webb Silversmith, NP

## 2016-04-11 NOTE — Progress Notes (Signed)
Pre visit review using our clinic review tool, if applicable. No additional management support is needed unless otherwise documented below in the visit note. 

## 2016-04-18 ENCOUNTER — Telehealth: Payer: Self-pay | Admitting: Family Medicine

## 2016-04-18 ENCOUNTER — Ambulatory Visit: Payer: Self-pay | Admitting: Family Medicine

## 2016-04-18 DIAGNOSIS — Z0289 Encounter for other administrative examinations: Secondary | ICD-10-CM

## 2016-04-18 NOTE — Telephone Encounter (Signed)
Patient did not come in for their appointment today for 4-6 week follow up Please let me know if patient needs to be contacted immediately for follow up or no follow up needed.

## 2016-04-18 NOTE — Telephone Encounter (Addendum)
No f/u at this time. 

## 2016-05-07 ENCOUNTER — Other Ambulatory Visit: Payer: Self-pay | Admitting: Family Medicine

## 2016-05-07 DIAGNOSIS — F331 Major depressive disorder, recurrent, moderate: Secondary | ICD-10-CM

## 2016-05-11 ENCOUNTER — Other Ambulatory Visit: Payer: Self-pay | Admitting: Family Medicine

## 2016-05-11 NOTE — Telephone Encounter (Signed)
plz phone in. 

## 2016-05-11 NOTE — Telephone Encounter (Signed)
Ok to refill? Last filled 03/09/16 #[id number]

## 2016-05-12 NOTE — Telephone Encounter (Signed)
Rx called in as directed.   

## 2016-06-10 ENCOUNTER — Encounter: Payer: Self-pay | Admitting: Primary Care

## 2016-06-10 ENCOUNTER — Ambulatory Visit (INDEPENDENT_AMBULATORY_CARE_PROVIDER_SITE_OTHER): Payer: Federal, State, Local not specified - PPO | Admitting: Primary Care

## 2016-06-10 VITALS — BP 120/78 | HR 78 | Temp 97.7°F | Wt 202.8 lb

## 2016-06-10 DIAGNOSIS — Z3201 Encounter for pregnancy test, result positive: Secondary | ICD-10-CM | POA: Diagnosis not present

## 2016-06-10 LAB — HCG, QUANTITATIVE, PREGNANCY

## 2016-06-10 LAB — POCT URINE PREGNANCY: PREG TEST UR: POSITIVE — AB

## 2016-06-10 NOTE — Progress Notes (Signed)
Subjective:    Patient ID: Amanda Stout, female    DOB: 1975-08-09, 40 y.o.   MRN: [id number]  HPI  Amanda Stout is a 40 year old female who presents today with a chief complaint of nausea. She also reports dizziness, irritability, breast swelling, muscle aches. LMP was November 3rd. She denies abdominal cramping, hematuria, vaginal discharge. She has vomited once this morning. She also reports photophobia, diarrhea. Her symptoms began Monday this week and have progressed. She and her husband have been attempting to become pregnant for the past 15 months. She's not checked a home pregnancy test.  Review of Systems  Constitutional: Negative for fever.  Eyes: Positive for photophobia.  Gastrointestinal: Positive for nausea and vomiting. Negative for abdominal pain, constipation and diarrhea.  Genitourinary: Negative for dysuria, flank pain, frequency, pelvic pain and vaginal discharge.       Breast tenderness and swelling  Neurological: Positive for dizziness and headaches.       Past Medical History:  Diagnosis Date  . ACL injury tear 04/2015   right  . Arthritis    L4-5  . Chronic migraine   . Lower back pain 2010   s/p herniated disc  . Perennial allergic rhinitis      Social History   Social History  . Marital status: Single    Spouse name: N/A  . Number of children: N/A  . Years of education: N/A   Occupational History  . Not on file.   Social History Main Topics  . Smoking status: Never Smoker  . Smokeless tobacco: Never Used  . Alcohol use Yes     Comment: 2 x/week  . Drug use: No  . Sexual activity: Not Currently   Other Topics Concern  . Not on file   Social History Narrative   Married 2016, has step son, 1 dog and 1 cat   Occupation: Passenger transport manager for [employer]   Edu: BS   Activity: walking dog, more intense exercises hurts back   Diet: good water, fruits/vegetables daily    Past Surgical History:  Procedure Laterality Date  . KNEE ARTHROSCOPY  WITH ANTERIOR CRUCIATE LIGAMENT (ACL) REPAIR Right 04/10/2015   Amanda Bond, MD;  [place]  . WISDOM TOOTH EXTRACTION  1998    Family History  Problem Relation Age of Onset  . Cancer Mother 68    uterine  . Hyperlipidemia Father   . Hypertension Father   . Stroke Maternal Grandmother   . CAD Maternal Grandfather 62    MI  . Hypertension Maternal Grandfather   . CAD Paternal Grandfather 38    MI  . Hypertension Paternal Grandfather     Allergies  Allergen Reactions  . Lactose Intolerance (Gi) Other (See Comments)    GI UPSET    Current Outpatient Prescriptions on File Prior to Visit  Medication Sig Dispense Refill  . aspirin-acetaminophen-caffeine (EXCEDRIN MIGRAINE) 250-250-65 MG tablet Take 1 tablet by mouth every 6 (six) hours as needed for headache. (Patient not taking: Reported on 06/10/2016) 30 tablet 11  . baclofen (LIORESAL) 10 MG tablet Take 1 tablet (10 mg total) by mouth 3 (three) times daily. As needed for muscle spasm (Patient not taking: Reported on 06/10/2016) 50 tablet 0  . DULoxetine (CYMBALTA) 60 MG capsule Take 1 capsule (60 mg total) by mouth daily. (Patient not taking: Reported on 06/10/2016) 30 capsule 2  . DULoxetine (CYMBALTA) 60 MG capsule Take 1 capsule (60 mg total) by mouth daily. **MUST  HAVE FOLLOW UP FOR FURTHER REFILLS** (Patient not taking: Reported on 06/10/2016) 30 capsule 0  . eletriptan (RELPAX) 40 MG tablet TAKE 1 TABLET BY MOUTH AS NEEDED FOR MIGRAINE OR HEADACHE MAY REPEAT IN 2 HOURS IF HEADACHE PERSISTS (Patient not taking: Reported on 06/10/2016) 5 tablet 3  . ibuprofen (ADVIL,MOTRIN) 800 MG tablet Take 1 tablet (800 mg total) by mouth every 8 (eight) hours as needed. (Patient not taking: Reported on 06/10/2016) 30 tablet 11  . loratadine-pseudoephedrine (CLARITIN-D 24 HOUR) 10-240 MG 24 hr tablet Take 1 tablet by mouth daily. (Patient not taking: Reported on 06/10/2016) 30 tablet 11  . LORazepam (ATIVAN) 0.5 MG tablet TAKE 1/2 TO  1 TABLET BY MOUTH 2 TIMES A DAY AS NEEDED FOR ANXIETY (Patient not taking: Reported on 06/10/2016) 30 tablet 0  . meclizine (ANTIVERT) 25 MG tablet Take 1 tablet (25 mg total) by mouth 3 (three) times daily as needed for dizziness. (Patient not taking: Reported on 06/10/2016) 30 tablet 0  . Prenatal Multivit-Min-Fe-FA (PRE-NATAL PO) Take by mouth.    . SUMAtriptan (IMITREX) 100 MG tablet TAKE 1 TABLET BY MOUTH EVERY 2 HOURS AS NEEDED MIGRAINE, MAY REPEAT IN 2 HOURS IF HEADACHE PERSISTS (Patient not taking: Reported on 06/10/2016) 10 tablet 6  . valACYclovir (VALTREX) 1000 MG tablet Take 1 tablet (1,000 mg total) by mouth 2 (two) times daily as needed. (Patient not taking: Reported on 06/10/2016) 30 tablet 3   No current facility-administered medications on file prior to visit.     BP 120/78   Pulse 78   Temp 97.7 F (36.5 C) (Oral)   Wt 202 lb 12.8 oz (92 kg)   LMP 05/06/2016   SpO2 99%   BMI 32.73 kg/m    Objective:   Physical Exam  Constitutional: She is oriented to person, place, and time. She appears well-nourished.  HENT:  Right Ear: Tympanic membrane and ear canal normal.  Left Ear: Tympanic membrane and ear canal normal.  Nose: Right sinus exhibits no maxillary sinus tenderness and no frontal sinus tenderness. Left sinus exhibits no maxillary sinus tenderness and no frontal sinus tenderness.  Mouth/Throat: Oropharynx is clear and moist.  Eyes: Conjunctivae are normal.  Neck: Neck supple.  Cardiovascular: Normal rate and regular rhythm.   Pulmonary/Chest: Effort normal and breath sounds normal. She has no wheezes. She has no rales.  Abdominal: Soft. Bowel sounds are normal. There is no tenderness.  Lymphadenopathy:    She has no cervical adenopathy.  Neurological: She is alert and oriented to person, place, and time.  Skin: Skin is warm and dry.  Psychiatric: She has a normal mood and affect.          Assessment & Plan:  Dizziness and Nausea:  Also with symptoms as  discussed above. Exam today unremarkable, no suspicion for bacterial or viral involvement, abdominal exam unremarkable. No urinary symptoms. Urine Pregnancy: Positive. Highly suspect symptoms related to early pregnancy given variety of symptoms without correlation to examination and presentation. HCG Quantitative pending. Discussed to establish with GYN and start prenatal vitamins. She's not taken any of her Rx meds in a while. Discussed to consult with GYN. Return precautions provided.  Sheral Flow, NP

## 2016-06-10 NOTE — Progress Notes (Signed)
Pre visit review using our clinic review tool, if applicable. No additional management support is needed unless otherwise documented below in the visit note. 

## 2016-06-10 NOTE — Patient Instructions (Signed)
Complete lab work prior to leaving today. I will notify you of your results once received.   Schedule a visit with your GYN for prenatal care.  Start taking Pre-Natal Vitamins.  You may take tylenol and Guaifenesin for cough/cold symptoms.  Congratulations!!! It was a pleasure meeting you!   Prenatal Care WHAT IS PRENATAL CARE? Prenatal care is the process of caring for a pregnant woman before she gives birth. Prenatal care makes sure that she and her baby remain as healthy as possible throughout pregnancy. Prenatal care may be provided by a midwife, family practice health care provider, or a childbirth and pregnancy specialist (obstetrician). Prenatal care may include physical examinations, testing, treatments, and education on nutrition, lifestyle, and social support services. WHY IS PRENATAL CARE SO IMPORTANT? Early and consistent prenatal care increases the chance that you and your baby will remain healthy throughout your pregnancy. This type of care also decreases a baby's risk of being born too early (prematurely), or being born smaller than expected (small for gestational age). Any underlying medical conditions you may have that could pose a risk during your pregnancy are discussed during prenatal care visits. You will also be monitored regularly for any new conditions that may arise during your pregnancy so they can be treated quickly and effectively. WHAT HAPPENS DURING PRENATAL CARE VISITS? Prenatal care visits may include the following: Discussion Tell your health care provider about any new signs or symptoms you have experienced since your last visit. These might include:  Nausea or vomiting.  Increased or decreased level of energy.  Difficulty sleeping.  Back or leg pain.  Weight changes.  Frequent urination.  Shortness of breath with physical activity.  Changes in your skin, such as the development of a rash or itchiness.  Vaginal discharge or bleeding.  Feelings  of excitement or nervousness.  Changes in your baby's movements. You may want to write down any questions or topics you want to discuss with your health care provider and bring them with you to your appointment. Examination During your first prenatal care visit, you will likely have a complete physical exam. Your health care provider will often examine your vagina, cervix, and the position of your uterus, as well as check your heart, lungs, and other body systems. As your pregnancy progresses, your health care provider will measure the size of your uterus and your baby's position inside your uterus. He or she may also examine you for early signs of labor. Your prenatal visits may also include checking your blood pressure and, after about 10-12 weeks of pregnancy, listening to your baby's heartbeat. Testing Regular testing often includes:  Urinalysis. This checks your urine for glucose, protein, or signs of infection.  Blood count. This checks the levels of white and red blood cells in your body.  Tests for sexually transmitted infections (STIs). Testing for STIs at the beginning of pregnancy is routinely done and is required in many states.  Antibody testing. You will be checked to see if you are immune to certain illnesses, such as rubella, that can affect a developing fetus.  Glucose screen. Around 24-28 weeks of pregnancy, your blood glucose level will be checked for signs of gestational diabetes. Follow-up tests may be recommended.  Group B strep. This is a bacteria that is commonly found inside a woman's vagina. This test will inform your health care provider if you need an antibiotic to reduce the amount of this bacteria in your body prior to labor and childbirth.  Ultrasound. Many  pregnant women undergo an ultrasound screening around 18-20 weeks of pregnancy to evaluate the health of the fetus and check for any developmental abnormalities.  HIV (human immunodeficiency virus) testing.  Early in your pregnancy, you will be screened for HIV. If you are at high risk for HIV, this test may be repeated during your third trimester of pregnancy. You may be offered other testing based on your age, personal or family medical history, or other factors. HOW OFTEN SHOULD I PLAN TO SEE MY HEALTH CARE PROVIDER FOR PRENATAL CARE? Your prenatal care check-up schedule depends on any medical conditions you have before, or develop during, your pregnancy. If you do not have any underlying medical conditions, you will likely be seen for checkups:  Monthly, during the first 6 months of pregnancy.  Twice a month during months 7 and 8 of pregnancy.  Weekly starting in the 9th month of pregnancy and until delivery. If you develop signs of early labor or other concerning signs or symptoms, you may need to see your health care provider more often. Ask your health care provider what prenatal care schedule is best for you. WHAT CAN I DO TO KEEP MYSELF AND MY BABY AS HEALTHY AS POSSIBLE DURING MY PREGNANCY?  Take a prenatal vitamin containing 400 micrograms (0.4 mg) of folic acid every day. Your health care provider may also ask you to take additional vitamins such as iodine, vitamin D, iron, copper, and zinc.  Take 1500-2000 mg of calcium daily starting at your 20th week of pregnancy until you deliver your baby.  Make sure you are up to date on your vaccinations. Unless directed otherwise by your health care provider:  You should receive a tetanus, diphtheria, and pertussis (Tdap) vaccination between the 27th and 36th week of your pregnancy, regardless of when your last Tdap immunization occurred. This helps protect your baby from whooping cough (pertussis) after he or she is born.  You should receive an annual inactivated influenza vaccine (IIV) to help protect you and your baby from influenza. This can be done at any point during your pregnancy.  Eat a well-rounded diet that includes:  Fresh fruits  and vegetables.  Lean proteins.  Calcium-rich foods such as milk, yogurt, hard cheeses, and dark, leafy greens.  Whole grain breads.  Do noteat seafood high in mercury, including:  Swordfish.  Tilefish.  Shark.  King mackerel.  More than 6 oz tuna per week.  Do not eat:  Raw or undercooked meats or eggs.  Unpasteurized foods, such as soft cheeses (brie, blue, or feta), juices, and milks.  Lunch meats.  Hot dogs that have not been heated until they are steaming.  Drink enough water to keep your urine clear or pale yellow. For many women, this may be 10 or more 8 oz glasses of water each day. Keeping yourself hydrated helps deliver nutrients to your baby and may prevent the start of pre-term uterine contractions.  Do not use any tobacco products including cigarettes, chewing tobacco, or electronic cigarettes. If you need help quitting, ask your health care provider.  Do not drink beverages containing alcohol. No safe level of alcohol consumption during pregnancy has been determined.  Do not use any illegal drugs. These can harm your developing baby or cause a miscarriage.  Ask your health care provider or pharmacist before taking any prescription or over-the-counter medicines, herbs, or supplements.  Limit your caffeine intake to no more than 200 mg per day.  Exercise. Unless told otherwise by your health  care provider, try to get 30 minutes of moderate exercise most days of the week. Do not  do high-impact activities, contact sports, or activities with a high risk of falling, such as horseback riding or downhill skiing.  Get plenty of rest.  Avoid anything that raises your body temperature, such as hot tubs and saunas.  If you own a cat, do not empty its litter box. Bacteria contained in cat feces can cause an infection called toxoplasmosis. This can result in serious harm to the fetus.  Stay away from chemicals such as insecticides, lead, mercury, and cleaning or  paint products that contain solvents.  Do not have any X-rays taken unless medically necessary.  Take a childbirth and breastfeeding preparation class. Ask your health care provider if you need a referral or recommendation. This information is not intended to replace advice given to you by your health care provider. Make sure you discuss any questions you have with your health care provider. Document Released: 06/23/2003 Document Revised: 11/23/2015 Document Reviewed: 09/04/2013 Elsevier Interactive Patient Education  2017 [employer].

## 2016-07-04 NOTE — L&D Delivery Note (Addendum)
      Delivery Note   Amanda Stout is a 41 y.o. G1P0 at [redacted]w[redacted]d Estimated Date of Delivery: 02/09/17  PRE-OPERATIVE DIAGNOSIS:  1) [redacted]w[redacted]d pregnancy.  2)  PROM 3)  Poor maternal expulsive effort  POST-OPERATIVE DIAGNOSIS:  1) [redacted]w[redacted]d pregnancy s/p Vaginal, Vacuum (Extractor)   Delivery Type: Vaginal, Vacuum (Extractor)    Delivery Clinician: Harlin Heys   Delivery Anesthesia: Epidural   Labor Complications:     Additional complications: PROM - prolonged second stage - poor maternal expulsive effort.  ESTIMATED BLOOD LOSS: 125  ml    FINDINGS:   1) female infant, Apgar scores of 6  at 1 minute and 8  at 5 minutes and a birthweight of    ounces.    2) Nuchal cord: No - True knot  SPECIMENS:   PLACENTA:   Appearance: Intact    Removal: Spontaneous       DISPOSITION:  Infant left in stable condition in the delivery room, with L&D personnel and mother,  NARRATIVE SUMMARY: Labor course:  Ms. Amanda Stout is a G1P0 at [redacted]w[redacted]d who presented for PROM at term.  She was begun on pitocin and eventually progressed well. Despite her epidural she complained of pain in the second stage and only pushed intermittently.  The epidural was turned up with good pain relief, but maternal expulsive effort remained poor.  She "rested" several times for greater than 10 minutes during the second stage.  Different techniques and positions were attempted without success.  The patient eventually decided for an vacuum assisted delivery.   At 12:58 AM a viable female was delivered via Vaginal, Vacuum Neurosurgeon).  Presentation: vertex; Position: Left,, Occiput,, Anterior; Station: +2.  Verbal consent: obtained from patient.  Risks and benefits discussed in detail.  Risks include, but are not limited to the risks of anesthesia, bleeding, infection, damage to maternal tissues, fetal cephalhematoma.  There is also the risk of inability to effect vaginal delivery of the head, or shoulder dystocia that cannot be  resolved by established maneuvers, leading to the need for emergency cesarean section.   Over three contractions vacuum traction was employed with good descent with each push.  The head was delilvered in a controlled manner.  The infant cried spontaneously.    The placenta delivered without problems and was noted to be complete. A perineal and vaginal examination was performed. Episiotomy/Lacerations: 1st degree  Episiotomy or lacerations were repaired with Vicryl suture using local anesthesia. The patient tolerated this well.  Finis Bud, M.D. 01/29/2017 1:22 AM

## 2016-07-07 DIAGNOSIS — Z3687 Encounter for antenatal screening for uncertain dates: Secondary | ICD-10-CM | POA: Diagnosis not present

## 2016-07-07 DIAGNOSIS — O99211 Obesity complicating pregnancy, first trimester: Secondary | ICD-10-CM | POA: Diagnosis not present

## 2016-07-07 DIAGNOSIS — Z3401 Encounter for supervision of normal first pregnancy, first trimester: Secondary | ICD-10-CM | POA: Diagnosis not present

## 2016-07-07 DIAGNOSIS — Z6832 Body mass index (BMI) 32.0-32.9, adult: Secondary | ICD-10-CM | POA: Diagnosis not present

## 2016-07-08 ENCOUNTER — Other Ambulatory Visit: Payer: Self-pay | Admitting: Obstetrics and Gynecology

## 2016-07-08 DIAGNOSIS — Z2839 Other underimmunization status: Secondary | ICD-10-CM | POA: Insufficient documentation

## 2016-07-08 DIAGNOSIS — O09521 Supervision of elderly multigravida, first trimester: Secondary | ICD-10-CM | POA: Insufficient documentation

## 2016-07-08 DIAGNOSIS — O9989 Other specified diseases and conditions complicating pregnancy, childbirth and the puerperium: Secondary | ICD-10-CM

## 2016-07-08 DIAGNOSIS — Z283 Underimmunization status: Secondary | ICD-10-CM | POA: Insufficient documentation

## 2016-07-08 DIAGNOSIS — O09899 Supervision of other high risk pregnancies, unspecified trimester: Secondary | ICD-10-CM | POA: Insufficient documentation

## 2016-07-08 DIAGNOSIS — Z369 Encounter for antenatal screening, unspecified: Secondary | ICD-10-CM

## 2016-07-11 DIAGNOSIS — B951 Streptococcus, group B, as the cause of diseases classified elsewhere: Secondary | ICD-10-CM | POA: Insufficient documentation

## 2016-07-14 DIAGNOSIS — Z3401 Encounter for supervision of normal first pregnancy, first trimester: Secondary | ICD-10-CM | POA: Diagnosis not present

## 2016-07-14 DIAGNOSIS — O09511 Supervision of elderly primigravida, first trimester: Secondary | ICD-10-CM | POA: Diagnosis not present

## 2016-07-14 DIAGNOSIS — Z3A10 10 weeks gestation of pregnancy: Secondary | ICD-10-CM | POA: Diagnosis not present

## 2016-07-28 DIAGNOSIS — N3001 Acute cystitis with hematuria: Secondary | ICD-10-CM | POA: Diagnosis not present

## 2016-07-29 DIAGNOSIS — O09519 Supervision of elderly primigravida, unspecified trimester: Secondary | ICD-10-CM | POA: Diagnosis not present

## 2016-07-29 DIAGNOSIS — O09521 Supervision of elderly multigravida, first trimester: Secondary | ICD-10-CM | POA: Diagnosis not present

## 2016-07-29 DIAGNOSIS — Z1379 Encounter for other screening for genetic and chromosomal anomalies: Secondary | ICD-10-CM | POA: Diagnosis not present

## 2016-07-29 DIAGNOSIS — Z3687 Encounter for antenatal screening for uncertain dates: Secondary | ICD-10-CM | POA: Diagnosis not present

## 2016-07-29 DIAGNOSIS — Z3A12 12 weeks gestation of pregnancy: Secondary | ICD-10-CM | POA: Diagnosis not present

## 2016-08-01 ENCOUNTER — Ambulatory Visit: Payer: Federal, State, Local not specified - PPO

## 2016-09-01 ENCOUNTER — Telehealth: Payer: Self-pay

## 2016-09-01 DIAGNOSIS — F419 Anxiety disorder, unspecified: Secondary | ICD-10-CM

## 2016-09-01 NOTE — Telephone Encounter (Signed)
Please advise 

## 2016-09-01 NOTE — Telephone Encounter (Signed)
OB patient asking for refill on Ambien. AMS is out of office today. Please advise.

## 2016-09-01 NOTE — Telephone Encounter (Signed)
Pt called and states refill denied for sertraline 50mg .  She was seen Mon.  AMS originally rx'd this early on in her preg   Pt concerned as to why it was denied.

## 2016-09-02 MED ORDER — SERTRALINE HCL 50 MG PO TABS: 50.0000 mg | ORAL_TABLET | Freq: Every day | ORAL | 4 refills | Status: DC

## 2016-09-02 NOTE — Telephone Encounter (Signed)
Pt aware. KJ CMA 

## 2016-09-02 NOTE — Telephone Encounter (Signed)
Rx sent 

## 2016-09-23 ENCOUNTER — Other Ambulatory Visit: Payer: Self-pay | Admitting: Obstetrics and Gynecology

## 2016-09-23 DIAGNOSIS — Z3492 Encounter for supervision of normal pregnancy, unspecified, second trimester: Secondary | ICD-10-CM

## 2016-09-26 ENCOUNTER — Ambulatory Visit (INDEPENDENT_AMBULATORY_CARE_PROVIDER_SITE_OTHER): Payer: Federal, State, Local not specified - PPO | Admitting: Obstetrics and Gynecology

## 2016-09-26 ENCOUNTER — Encounter: Payer: Self-pay | Admitting: Obstetrics and Gynecology

## 2016-09-26 ENCOUNTER — Ambulatory Visit (INDEPENDENT_AMBULATORY_CARE_PROVIDER_SITE_OTHER): Payer: Federal, State, Local not specified - PPO

## 2016-09-26 VITALS — BP 124/76 | Wt 204.0 lb

## 2016-09-26 DIAGNOSIS — O09512 Supervision of elderly primigravida, second trimester: Secondary | ICD-10-CM

## 2016-09-26 DIAGNOSIS — Z369 Encounter for antenatal screening, unspecified: Secondary | ICD-10-CM | POA: Diagnosis not present

## 2016-09-26 DIAGNOSIS — O4402 Placenta previa specified as without hemorrhage, second trimester: Secondary | ICD-10-CM

## 2016-09-26 DIAGNOSIS — Z3A21 21 weeks gestation of pregnancy: Secondary | ICD-10-CM

## 2016-09-26 DIAGNOSIS — F419 Anxiety disorder, unspecified: Secondary | ICD-10-CM

## 2016-09-26 DIAGNOSIS — O0992 Supervision of high risk pregnancy, unspecified, second trimester: Secondary | ICD-10-CM

## 2016-09-26 DIAGNOSIS — Z3492 Encounter for supervision of normal pregnancy, unspecified, second trimester: Secondary | ICD-10-CM

## 2016-09-26 DIAGNOSIS — O0993 Supervision of high risk pregnancy, unspecified, third trimester: Secondary | ICD-10-CM | POA: Insufficient documentation

## 2016-09-26 DIAGNOSIS — Z6832 Body mass index (BMI) 32.0-32.9, adult: Secondary | ICD-10-CM

## 2016-09-26 DIAGNOSIS — O99212 Obesity complicating pregnancy, second trimester: Secondary | ICD-10-CM

## 2016-09-26 MED ORDER — SERTRALINE HCL 100 MG PO TABS: 100.0000 mg | ORAL_TABLET | Freq: Every day | ORAL | 3 refills | Status: DC

## 2016-09-26 MED ORDER — BUTALBITAL-APAP-CAFFEINE 50-325-40 MG PO TABS: 1.0000 | ORAL_TABLET | Freq: Four times a day (QID) | ORAL | 1 refills | Status: DC | PRN

## 2016-09-26 NOTE — Progress Notes (Signed)
Anatomy scan today with suboptimal 4ch, LVOT. Repeat at [facility]. Marginal placenta previa. No vb.

## 2016-09-29 DIAGNOSIS — Z20821 Contact with and (suspected) exposure to Zika virus: Secondary | ICD-10-CM

## 2016-09-29 HISTORY — DX: Contact with and (suspected) exposure to Zika virus: Z20.821

## 2016-10-10 ENCOUNTER — Ambulatory Visit (INDEPENDENT_AMBULATORY_CARE_PROVIDER_SITE_OTHER): Payer: Federal, State, Local not specified - PPO

## 2016-10-10 ENCOUNTER — Ambulatory Visit (INDEPENDENT_AMBULATORY_CARE_PROVIDER_SITE_OTHER): Payer: Federal, State, Local not specified - PPO | Admitting: Obstetrics & Gynecology

## 2016-10-10 ENCOUNTER — Encounter: Payer: Self-pay | Admitting: Obstetrics & Gynecology

## 2016-10-10 VITALS — BP 112/62 | Wt 208.0 lb

## 2016-10-10 DIAGNOSIS — O4402 Placenta previa specified as without hemorrhage, second trimester: Secondary | ICD-10-CM

## 2016-10-10 DIAGNOSIS — O0992 Supervision of high risk pregnancy, unspecified, second trimester: Secondary | ICD-10-CM

## 2016-10-10 DIAGNOSIS — O99212 Obesity complicating pregnancy, second trimester: Secondary | ICD-10-CM

## 2016-10-10 DIAGNOSIS — Z3A23 23 weeks gestation of pregnancy: Secondary | ICD-10-CM

## 2016-10-10 DIAGNOSIS — O09512 Supervision of elderly primigravida, second trimester: Secondary | ICD-10-CM

## 2016-10-10 NOTE — Patient Instructions (Signed)
Low Lying vs Placenta Previa Placenta previa is a condition in which the placenta implants in the lower part of the uterus in pregnant women. The placenta either partially or completely covers the opening to the cervix. This is a problem because the baby must pass through the cervix during delivery. There are three types of placenta previa:  Marginal placenta previa. The placenta reaches within an inch (2.5 cm) of the cervical opening but does not cover it.  Partial placenta previa. The placenta covers part of the cervical opening.  Complete placenta previa. The placenta covers the entire cervical opening. If the previa is marginal or partial and it is diagnosed in the first half of pregnancy, the placenta may move into a normal position as the pregnancy progresses and may no longer cover the cervix. It is important to keep all prenatal visits with your health care provider so you can be more closely monitored. What are the causes? The cause of this condition is not known. What increases the risk? This condition is more likely to develop in women who:  Are carrying more than one baby (multiples).  Have an abnormally shaped uterus.  Have scars on the lining of the uterus.  Have had surgeries involving the uterus, such as a cesarean delivery.  Have delivered a baby before.  Have a history of placenta previa.  Have smoked or used cocaine during pregnancy.  Are age 50 or older during pregnancy. What are the signs or symptoms? The main symptom of this condition is sudden, painless vaginal bleeding during the second half of pregnancy. The amount of bleeding can be very light at first, and it usually stops on its own. Heavier bleeding episodes may also happen. Some women with placenta previa may have no bleeding at all. How is this diagnosed?  This condition is diagnosed:  From an ultrasound. This test uses sound waves to find where the placenta is located before you have any bleeding  episodes.  During a checkup after vaginal bleeding is noticed.  If you are diagnosed with a partial or complete previa, digital exams with fingers will generally be avoided. Your health care provider will still perform a speculum exam.  If you did not have an ultrasound during your pregnancy, placenta previa may not be diagnosed until bleeding occurs during labor. How is this treated? Treatment for this condition may include:  Decreased activity.  Bed rest at home or in the hospital.  Pelvic rest. Nothing is placed inside the vagina during pelvic rest. This means not having sex and not using tampons or douches.  A blood transfusion to replace blood that you have lost (maternal blood loss).  A cesarean delivery. This may be performed if:  The bleeding is heavy and cannot be controlled.  The placenta completely covers the cervix.  Medicines to stop premature labor or to help the baby's lungs to mature. This treatment may be used if you need delivery before your pregnancy is full-term. Your treatment will be decided based on:  How much you are bleeding, or whether the bleeding has stopped.  How far along you are in your pregnancy.  The condition of your baby.  The type of placenta previa that you have. Follow these instructions at home:  Get plenty of rest and lessen activity as told by your health care provider.  Stay on bed rest for as long as told by your health care provider.  Do not have sex, use tampons, use a douche, or place anything  inside of your vagina if your health care provider recommended pelvic rest.  Take over-the-counter and prescription medicines as told by your health care provider.  Keep all follow-up visits as told by your health care provider. This is important. Get help right away if:  You have vaginal bleeding, even if in small amounts and even if you have no pain.  You have cramping or regular contractions.  You have pain in your abdomen or  your lower back.  You have a feeling of increased pressure in your pelvis.  You have increased watery or bloody mucus from the vagina. This information is not intended to replace advice given to you by your health care provider. Make sure you discuss any questions you have with your health care provider. Document Released: 06/20/2005 Document Revised: 03/09/2016 Document Reviewed: 01/02/2016 Elsevier Interactive Patient Education  2017 [employer].

## 2016-10-10 NOTE — Progress Notes (Signed)
[place] discussed. Glucola nv.  Low Lying placenta risks discussed (1.11cm today); repeat US 30 weeks.  PNV. Breast feeding.  BC discussed.

## 2016-10-31 ENCOUNTER — Telehealth: Payer: Self-pay

## 2016-10-31 NOTE — Telephone Encounter (Signed)
Spoke w/pt. Pt is having slight burning w/urination & a sharp pain in lower right abdomen & some pressure on her cervix x6 days. States she was treated for UTI ~1st 2 months of her pregnancy. Rx or apt?

## 2016-10-31 NOTE — Telephone Encounter (Signed)
Pt states she is 28 wks & pt of AMS. She believes she has another UTI. Pt inquiring if apt is needed or if rx can be called in. [phone].

## 2016-11-01 ENCOUNTER — Ambulatory Visit (INDEPENDENT_AMBULATORY_CARE_PROVIDER_SITE_OTHER): Payer: Federal, State, Local not specified - PPO

## 2016-11-01 ENCOUNTER — Observation Stay
Admission: EM | Admit: 2016-11-01 | Discharge: 2016-11-01 | Disposition: A | Discharge status: Home / Self Care | Payer: Federal, State, Local not specified - PPO | Attending: Obstetrics and Gynecology | Admitting: Obstetrics and Gynecology

## 2016-11-01 DIAGNOSIS — O26892 Other specified pregnancy related conditions, second trimester: Secondary | ICD-10-CM | POA: Diagnosis not present

## 2016-11-01 DIAGNOSIS — R8290 Unspecified abnormal findings in urine: Secondary | ICD-10-CM

## 2016-11-01 DIAGNOSIS — R300 Dysuria: Secondary | ICD-10-CM

## 2016-11-01 DIAGNOSIS — R350 Frequency of micturition: Secondary | ICD-10-CM | POA: Diagnosis not present

## 2016-11-01 DIAGNOSIS — Z3A25 25 weeks gestation of pregnancy: Secondary | ICD-10-CM | POA: Diagnosis not present

## 2016-11-01 DIAGNOSIS — R51 Headache: Secondary | ICD-10-CM | POA: Insufficient documentation

## 2016-11-01 DIAGNOSIS — R3915 Urgency of urination: Secondary | ICD-10-CM | POA: Diagnosis not present

## 2016-11-01 DIAGNOSIS — E739 Lactose intolerance, unspecified: Secondary | ICD-10-CM | POA: Insufficient documentation

## 2016-11-01 DIAGNOSIS — R42 Dizziness and giddiness: Secondary | ICD-10-CM

## 2016-11-01 LAB — URINALYSIS, COMPLETE (UACMP) WITH MICROSCOPIC
BILIRUBIN URINE: NEGATIVE
GLUCOSE, UA: NEGATIVE mg/dL
KETONES UR: NEGATIVE mg/dL
NITRITE: NEGATIVE
PH: 7 (ref 5.0–8.0)
Protein, ur: NEGATIVE mg/dL
SPECIFIC GRAVITY, URINE: 1.002 — AB (ref 1.005–1.030)
WBC, UA: NONE SEEN WBC/hpf (ref 0–5)

## 2016-11-01 LAB — POCT URINALYSIS DIP (MANUAL ENTRY)
BILIRUBIN UA: NEGATIVE
BILIRUBIN UA: NEGATIVE mg/dL
Glucose, UA: NEGATIVE mg/dL
Nitrite, UA: NEGATIVE
PH UA: 6 (ref 5.0–8.0)
Protein Ur, POC: NEGATIVE mg/dL
Spec Grav, UA: <=1.005 — AB (ref 1.010–1.025)
Urobilinogen, UA: 0.2 E.U./dL

## 2016-11-01 MED ORDER — ACETAMINOPHEN 325 MG PO TABS: 650.0000 mg | ORAL_TABLET | ORAL | Status: DC | PRN

## 2016-11-01 MED ORDER — ONDANSETRON HCL 4 MG/2ML IJ SOLN: 4.0000 mg | Freq: Four times a day (QID) | INTRAMUSCULAR | Status: DC | PRN

## 2016-11-01 NOTE — Patient Instructions (Signed)
Results reviewed by BS c SDJ along c c/o no vag bleeding today but it was pink a few weeks ago and weird vision in left eye - it gets blurry and then resets.  Per SJD pt was adv to go to L&D for evaluation.  Pt was given urine samples to take with her.  Pt was going home to get her hsb before going to the hosp.

## 2016-11-01 NOTE — Telephone Encounter (Signed)
Appointment we have to send it for culture and see if UA is consistent with UTI

## 2016-11-01 NOTE — Telephone Encounter (Signed)
Spoke w/pt. Apt made for Nurse visit at 4:00 today.

## 2016-11-01 NOTE — Final Progress Note (Signed)
Physician Final Progress Note  Patient ID: Amanda Stout MRN: [id number] DOB/AGE: [birth year] 39 y.o.  Admit date: 11/01/2016 Admitting provider: Malachy Mood, MD Discharge date: 11/01/2016   Admission Diagnoses: Pressures, headaches, urinary frequency  Discharge Diagnoses: Pressures, headaches, urinary frequency  Consults: None   Patient is a 41 y.o. at [redacted]w[redacted]d presenting with urinary frequency, pressure, and headaches.  UA with microscopy normal. Has history of migraines on Fioricet.  Recent opthalmology exam a few months ago normal.  Normotensive and no protein on UA.  No labor complaints. +FM. No Vb    Significant Findings/ Diagnostic Studies: Results for orders placed or performed during the hospital encounter of 11/01/16 (from the past 24 hour(s))  Urinalysis, Complete w Microscopic     Status: Abnormal   Collection Time: 11/01/16  8:00 PM  Result Value Ref Range   Color, Urine STRAW (A) YELLOW   APPearance CLOUDY (A) CLEAR   Specific Gravity, Urine 1.002 (L) 1.005 - 1.030   pH 7.0 5.0 - 8.0   Glucose, UA NEGATIVE NEGATIVE mg/dL   Hgb urine dipstick SMALL (A) NEGATIVE   Bilirubin Urine NEGATIVE NEGATIVE   Ketones, ur NEGATIVE NEGATIVE mg/dL   Protein, ur NEGATIVE NEGATIVE mg/dL   Nitrite NEGATIVE NEGATIVE   Leukocytes, UA TRACE (A) NEGATIVE   RBC / HPF 0-5 0 - 5 RBC/hpf   WBC, UA NONE SEEN 0 - 5 WBC/hpf   Bacteria, UA FEW (A) NONE SEEN   Squamous Epithelial / LPF 0-5 (A) NONE SEEN   Mucous PRESENT    Procedures: NST Baseline: 140 Variability: moderate Accelerations: present 10x10 Decelerations: present Tocometry: none The patient was monitored for 30 minutes, fetal heart rate tracing was deemed reactive, category I tracing,   Discharge Condition: good  Disposition: 01-Home or Self Care  Diet: Regular diet  Discharge Activity: Activity as tolerated  Discharge Instructions    Discharge activity:  No Restrictions    Complete by:  As directed    Discharge  diet:  No restrictions    Complete by:  As directed    No sexual activity restrictions    Complete by:  As directed    Notify physician for a general feeling that "something is not right"    Complete by:  As directed    Notify physician for increase or change in vaginal discharge    Complete by:  As directed    Notify physician for intestinal cramps, with or without diarrhea, sometimes described as "gas pain"    Complete by:  As directed    Notify physician for leaking of fluid    Complete by:  As directed    Notify physician for low, dull backache, unrelieved by heat or Tylenol    Complete by:  As directed    Notify physician for menstrual like cramps    Complete by:  As directed    Notify physician for pelvic pressure    Complete by:  As directed    Notify physician for uterine contractions.  These may be painless and feel like the uterus is tightening or the baby is  "balling up"    Complete by:  As directed    Notify physician for vaginal bleeding    Complete by:  As directed    PRETERM LABOR:  Includes any of the follwing symptoms that occur between 20 - [redacted] weeks gestation.  If these symptoms are not stopped, preterm labor can result in preterm delivery, placing your baby at risk  Complete by:  As directed      Allergies as of 11/01/2016      Reactions   Lactose Intolerance (gi) Other (See Comments)   GI UPSET      Medication List    TAKE these medications   butalbital-acetaminophen-caffeine 50-325-40 MG tablet Commonly known as:  FIORICET, ESGIC Take 1 tablet by mouth every 6 (six) hours as needed for headache.   PRE-NATAL PO Take by mouth.   sertraline 100 MG tablet Commonly known as:  ZOLOFT Take 1 tablet (100 mg total) by mouth daily. What changed:  Another medication with the same name was removed. Continue taking this medication, and follow the directions you see here.        Total time spent taking care of this patient: 20 minutes  Signed: Malachy Mood 11/01/2016, 9:05 PM

## 2016-11-01 NOTE — Discharge Summary (Signed)
See final progress note. 

## 2016-11-01 NOTE — OB Triage Note (Signed)
Amanda Stout here with c/o dizziness since Monday, denies bleeding, LOF, reports that she was seen at [facility] for a possible UTI today, they sent here here for evaluation.

## 2016-11-01 NOTE — Telephone Encounter (Signed)
Per SDJ ok to bring in as nurse visit as long as pt not having fever or other more complicated complaints.

## 2016-11-01 NOTE — Telephone Encounter (Signed)
Spoke w/pt. Notified of need for apt. Per [place] office, 1st available is Monday @2 :10 w/JEG w/o asking permission for overbook. Pt concerned that s&s have been going on for ~1 wk. Advised I would ask MD if ok to bring pt in as Nurse visit to collect urine dipstick. Will call pt back shortly.

## 2016-11-02 DIAGNOSIS — F4322 Adjustment disorder with anxiety: Secondary | ICD-10-CM | POA: Diagnosis not present

## 2016-11-03 DIAGNOSIS — K080 Exfoliation of teeth due to systemic causes: Secondary | ICD-10-CM | POA: Diagnosis not present

## 2016-11-03 LAB — URINE CULTURE: SPECIAL REQUESTS: NORMAL

## 2016-11-04 ENCOUNTER — Encounter: Payer: Self-pay | Admitting: Obstetrics and Gynecology

## 2016-11-04 ENCOUNTER — Other Ambulatory Visit: Payer: Self-pay | Admitting: Obstetrics and Gynecology

## 2016-11-04 MED ORDER — CEPHALEXIN 500 MG PO CAPS: 500.0000 mg | ORAL_CAPSULE | Freq: Two times a day (BID) | ORAL | 0 refills | Status: DC

## 2016-11-04 NOTE — Progress Notes (Signed)
AMS has already reviewed results & emailed pt thru My Chart regarding results as he was unable to reach her by phone @11 :33am today.

## 2016-11-04 NOTE — Progress Notes (Signed)
Please contact patient

## 2016-11-07 ENCOUNTER — Ambulatory Visit (INDEPENDENT_AMBULATORY_CARE_PROVIDER_SITE_OTHER): Payer: Federal, State, Local not specified - PPO | Admitting: Obstetrics and Gynecology

## 2016-11-07 ENCOUNTER — Other Ambulatory Visit: Payer: Federal, State, Local not specified - PPO

## 2016-11-07 VITALS — BP 118/68 | Wt 213.0 lb

## 2016-11-07 DIAGNOSIS — O09513 Supervision of elderly primigravida, third trimester: Secondary | ICD-10-CM

## 2016-11-07 DIAGNOSIS — O0992 Supervision of high risk pregnancy, unspecified, second trimester: Secondary | ICD-10-CM | POA: Diagnosis not present

## 2016-11-07 DIAGNOSIS — Z0489 Encounter for examination and observation for other specified reasons: Secondary | ICD-10-CM

## 2016-11-07 DIAGNOSIS — Z3A28 28 weeks gestation of pregnancy: Secondary | ICD-10-CM

## 2016-11-07 DIAGNOSIS — IMO0002 Reserved for concepts with insufficient information to code with codable children: Secondary | ICD-10-CM

## 2016-11-07 DIAGNOSIS — Z3A23 23 weeks gestation of pregnancy: Secondary | ICD-10-CM | POA: Diagnosis not present

## 2016-11-07 DIAGNOSIS — O4443 Low lying placenta NOS or without hemorrhage, third trimester: Secondary | ICD-10-CM

## 2016-11-07 DIAGNOSIS — O99212 Obesity complicating pregnancy, second trimester: Secondary | ICD-10-CM | POA: Diagnosis not present

## 2016-11-07 DIAGNOSIS — Z048 Encounter for examination and observation for other specified reasons: Secondary | ICD-10-CM

## 2016-11-07 DIAGNOSIS — O09512 Supervision of elderly primigravida, second trimester: Secondary | ICD-10-CM | POA: Diagnosis not present

## 2016-11-07 NOTE — Patient Instructions (Signed)
Third Trimester of Pregnancy The third trimester is from week 28 through week 40 (months 7 through 9). The third trimester is a time when the unborn baby (fetus) is growing rapidly. At the end of the ninth month, the fetus is about 20 inches in length and weighs 6-10 pounds. Body changes during your third trimester Your body will continue to go through many changes during pregnancy. The changes vary from woman to woman. During the third trimester:  Your weight will continue to increase. You can expect to gain 25-35 pounds (11-16 kg) by the end of the pregnancy.  You may begin to get stretch marks on your hips, abdomen, and breasts.  You may urinate more often because the fetus is moving lower into your pelvis and pressing on your bladder.  You may develop or continue to have heartburn. This is caused by increased hormones that slow down muscles in the digestive tract.  You may develop or continue to have constipation because increased hormones slow digestion and cause the muscles that push waste through your intestines to relax.  You may develop hemorrhoids. These are swollen veins (varicose veins) in the rectum that can itch or be painful.  You may develop swollen, bulging veins (varicose veins) in your legs.  You may have increased body aches in the pelvis, back, or thighs. This is due to weight gain and increased hormones that are relaxing your joints.  You may have changes in your hair. These can include thickening of your hair, rapid growth, and changes in texture. Some women also have hair loss during or after pregnancy, or hair that feels dry or thin. Your hair will most likely return to normal after your baby is born.  Your breasts will continue to grow and they will continue to become tender. A yellow fluid (colostrum) may leak from your breasts. This is the first milk you are producing for your baby.  Your belly button may stick out.  You may notice more swelling in your hands,  face, or ankles.  You may have increased tingling or numbness in your hands, arms, and legs. The skin on your belly may also feel numb.  You may feel short of breath because of your expanding uterus.  You may have more problems sleeping. This can be caused by the size of your belly, increased need to urinate, and an increase in your body's metabolism.  You may notice the fetus "dropping," or moving lower in your abdomen (lightening).  You may have increased vaginal discharge.  You may notice your joints feel loose and you may have pain around your pelvic bone.  What to expect at prenatal visits You will have prenatal exams every 2 weeks until week 36. Then you will have weekly prenatal exams. During a routine prenatal visit:  You will be weighed to make sure you and the baby are growing normally.  Your blood pressure will be taken.  Your abdomen will be measured to track your baby's growth.  The fetal heartbeat will be listened to.  Any test results from the previous visit will be discussed.  You may have a cervical check near your due date to see if your cervix has softened or thinned (effaced).  You will be tested for Group B streptococcus. This happens between 35 and 37 weeks.  Your health care provider may ask you:  What your birth plan is.  How you are feeling.  If you are feeling the baby move.  If you have had   any abnormal symptoms, such as leaking fluid, bleeding, severe headaches, or abdominal cramping.  If you are using any tobacco products, including cigarettes, chewing tobacco, and electronic cigarettes.  If you have any questions.  Other tests or screenings that may be performed during your third trimester include:  Blood tests that check for low iron levels (anemia).  Fetal testing to check the health, activity level, and growth of the fetus. Testing is done if you have certain medical conditions or if there are problems during the  pregnancy.  Nonstress test (NST). This test checks the health of your baby to make sure there are no signs of problems, such as the baby not getting enough oxygen. During this test, a belt is placed around your belly. The baby is made to move, and its heart rate is monitored during movement.  What is false labor? False labor is a condition in which you feel small, irregular tightenings of the muscles in the womb (contractions) that usually go away with rest, changing position, or drinking water. These are called Braxton Hicks contractions. Contractions may last for hours, days, or even weeks before true labor sets in. If contractions come at regular intervals, become more frequent, increase in intensity, or become painful, you should see your health care provider. What are the signs of labor?  Abdominal cramps.  Regular contractions that start at 10 minutes apart and become stronger and more frequent with time.  Contractions that start on the top of the uterus and spread down to the lower abdomen and back.  Increased pelvic pressure and dull back pain.  A watery or bloody mucus discharge that comes from the vagina.  Leaking of amniotic fluid. This is also known as your "water breaking." It could be a slow trickle or a gush. Let your health care provider know if it has a color or strange odor. If you have any of these signs, call your health care provider right away, even if it is before your due date. Follow these instructions at home: Medicines  Follow your health care provider's instructions regarding medicine use. Specific medicines may be either safe or unsafe to take during pregnancy.  Take a prenatal vitamin that contains at least 600 micrograms (mcg) of folic acid.  If you develop constipation, try taking a stool softener if your health care provider approves. Eating and drinking  Eat a balanced diet that includes fresh fruits and vegetables, whole grains, good sources of protein  such as meat, eggs, or tofu, and low-fat dairy. Your health care provider will help you determine the amount of weight gain that is right for you.  Avoid raw meat and uncooked cheese. These carry germs that can cause birth defects in the baby.  If you have low calcium intake from food, talk to your health care provider about whether you should take a daily calcium supplement.  Eat four or five small meals rather than three large meals a day.  Limit foods that are high in fat and processed sugars, such as fried and sweet foods.  To prevent constipation: ? Drink enough fluid to keep your urine clear or pale yellow. ? Eat foods that are high in fiber, such as fresh fruits and vegetables, whole grains, and beans. Activity  Exercise only as directed by your health care provider. Most women can continue their usual exercise routine during pregnancy. Try to exercise for 30 minutes at least 5 days a week. Stop exercising if you experience uterine contractions.  Avoid heavy   lifting.  Do not exercise in extreme heat or humidity, or at high altitudes.  Wear low-heel, comfortable shoes.  Practice good posture.  You may continue to have sex unless your health care provider tells you otherwise. Relieving pain and discomfort  Take frequent breaks and rest with your legs elevated if you have leg cramps or low back pain.  Take warm sitz baths to soothe any pain or discomfort caused by hemorrhoids. Use hemorrhoid cream if your health care provider approves.  Wear a good support bra to prevent discomfort from breast tenderness.  If you develop varicose veins: ? Wear support pantyhose or compression stockings as told by your healthcare provider. ? Elevate your feet for 15 minutes, 3-4 times a day. Prenatal care  Write down your questions. Take them to your prenatal visits.  Keep all your prenatal visits as told by your health care provider. This is important. Safety  Wear your seat belt at  all times when driving.  Make a list of emergency phone numbers, including numbers for family, friends, the hospital, and police and fire departments. General instructions  Avoid cat litter boxes and soil used by cats. These carry germs that can cause birth defects in the baby. If you have a cat, ask someone to clean the litter box for you.  Do not travel far distances unless it is absolutely necessary and only with the approval of your health care provider.  Do not use hot tubs, steam rooms, or saunas.  Do not drink alcohol.  Do not use any products that contain nicotine or tobacco, such as cigarettes and e-cigarettes. If you need help quitting, ask your health care provider.  Do not use any medicinal herbs or unprescribed drugs. These chemicals affect the formation and growth of the baby.  Do not douche or use tampons or scented sanitary pads.  Do not cross your legs for long periods of time.  To prepare for the arrival of your baby: ? Take prenatal classes to understand, practice, and ask questions about labor and delivery. ? Make a trial run to the hospital. ? Visit the hospital and tour the maternity area. ? Arrange for maternity or paternity leave through employers. ? Arrange for family and friends to take care of pets while you are in the hospital. ? Purchase a rear-facing car seat and make sure you know how to install it in your car. ? Pack your hospital bag. ? Prepare the baby's nursery. Make sure to remove all pillows and stuffed animals from the baby's crib to prevent suffocation.  Visit your dentist if you have not gone during your pregnancy. Use a soft toothbrush to brush your teeth and be gentle when you floss. Contact a health care provider if:  You are unsure if you are in labor or if your water has broken.  You become dizzy.  You have mild pelvic cramps, pelvic pressure, or nagging pain in your abdominal area.  You have lower back pain.  You have persistent  nausea, vomiting, or diarrhea.  You have an unusual or bad smelling vaginal discharge.  You have pain when you urinate. Get help right away if:  Your water breaks before 37 weeks.  You have regular contractions less than 5 minutes apart before 37 weeks.  You have a fever.  You are leaking fluid from your vagina.  You have spotting or bleeding from your vagina.  You have severe abdominal pain or cramping.  You have rapid weight loss or weight gain.    You have shortness of breath with chest pain.  You notice sudden or extreme swelling of your face, hands, ankles, feet, or legs.  Your baby makes fewer than 10 movements in 2 hours.  You have severe headaches that do not go away when you take medicine.  You have vision changes. Summary  The third trimester is from week 28 through week 40, months 7 through 9. The third trimester is a time when the unborn baby (fetus) is growing rapidly.  During the third trimester, your discomfort may increase as you and your baby continue to gain weight. You may have abdominal, leg, and back pain, sleeping problems, and an increased need to urinate.  During the third trimester your breasts will keep growing and they will continue to become tender. A yellow fluid (colostrum) may leak from your breasts. This is the first milk you are producing for your baby.  False labor is a condition in which you feel small, irregular tightenings of the muscles in the womb (contractions) that eventually go away. These are called Braxton Hicks contractions. Contractions may last for hours, days, or even weeks before true labor sets in.  Signs of labor can include: abdominal cramps; regular contractions that start at 10 minutes apart and become stronger and more frequent with time; watery or bloody mucus discharge that comes from the vagina; increased pelvic pressure and dull back pain; and leaking of amniotic fluid. This information is not intended to replace advice  given to you by your health care provider. Make sure you discuss any questions you have with your health care provider. Document Released: 06/14/2001 Document Revised: 11/26/2015 Document Reviewed: 08/21/2012 Elsevier Interactive Patient Education  2017 [employer]  

## 2016-11-07 NOTE — Progress Notes (Signed)
Sob/weight gain/28 week labs today

## 2016-11-07 NOTE — Progress Notes (Signed)
Increased fatigue otherwise doing well, some increased anxiety.  Discussed increasing zoloft now vs closer to delivery or adding buspar.

## 2016-11-09 LAB — 28 WEEK RH+PANEL
Basophils Absolute: 0 10*3/uL (ref 0.0–0.2)
Basos: 0 %
EOS (ABSOLUTE): 0.1 10*3/uL (ref 0.0–0.4)
Eos: 1 %
GESTATIONAL DIABETES SCREEN: 89 mg/dL (ref 65–139)
HIV SCREEN 4TH GENERATION: NONREACTIVE
Hematocrit: 32.7 % — ABNORMAL LOW (ref 34.0–46.6)
Hemoglobin: 10.7 g/dL — ABNORMAL LOW (ref 11.1–15.9)
IMMATURE GRANS (ABS): 0.1 10*3/uL (ref 0.0–0.1)
Immature Granulocytes: 1 %
Lymphocytes Absolute: 1.4 10*3/uL (ref 0.7–3.1)
Lymphs: 15 %
MCH: 30.5 pg (ref 26.6–33.0)
MCHC: 32.7 g/dL (ref 31.5–35.7)
MCV: 93 fL (ref 79–97)
MONOS ABS: 0.7 10*3/uL (ref 0.1–0.9)
Monocytes: 7 %
NEUTROS PCT: 76 %
Neutrophils Absolute: 7.2 10*3/uL — ABNORMAL HIGH (ref 1.4–7.0)
PLATELETS: 274 10*3/uL (ref 150–379)
RBC: 3.51 x10E6/uL — ABNORMAL LOW (ref 3.77–5.28)
RDW: 13.4 % (ref 12.3–15.4)
RPR Ser Ql: REACTIVE — AB
WBC: 9.4 10*3/uL (ref 3.4–10.8)

## 2016-11-09 LAB — RPR, QUANT+TP ABS (REFLEX)
Rapid Plasma Reagin, Quant: 1:1 {titer} — ABNORMAL HIGH
TREPONEMA PALLIDUM AB: NEGATIVE

## 2016-11-17 DIAGNOSIS — F4322 Adjustment disorder with anxiety: Secondary | ICD-10-CM | POA: Diagnosis not present

## 2016-11-23 ENCOUNTER — Encounter: Payer: Federal, State, Local not specified - PPO | Admitting: Obstetrics and Gynecology

## 2016-11-23 ENCOUNTER — Other Ambulatory Visit: Payer: Federal, State, Local not specified - PPO

## 2016-11-30 ENCOUNTER — Ambulatory Visit (INDEPENDENT_AMBULATORY_CARE_PROVIDER_SITE_OTHER): Payer: Federal, State, Local not specified - PPO

## 2016-11-30 ENCOUNTER — Ambulatory Visit (INDEPENDENT_AMBULATORY_CARE_PROVIDER_SITE_OTHER): Payer: Federal, State, Local not specified - PPO | Admitting: Advanced Practice Midwife

## 2016-11-30 VITALS — BP 116/68 | Wt 222.0 lb

## 2016-11-30 DIAGNOSIS — O09513 Supervision of elderly primigravida, third trimester: Secondary | ICD-10-CM

## 2016-11-30 DIAGNOSIS — IMO0002 Reserved for concepts with insufficient information to code with codable children: Secondary | ICD-10-CM

## 2016-11-30 DIAGNOSIS — Z3A28 28 weeks gestation of pregnancy: Secondary | ICD-10-CM | POA: Diagnosis not present

## 2016-11-30 DIAGNOSIS — Z048 Encounter for examination and observation for other specified reasons: Secondary | ICD-10-CM | POA: Diagnosis not present

## 2016-11-30 DIAGNOSIS — O4443 Low lying placenta NOS or without hemorrhage, third trimester: Secondary | ICD-10-CM | POA: Diagnosis not present

## 2016-11-30 DIAGNOSIS — Z3A29 29 weeks gestation of pregnancy: Secondary | ICD-10-CM

## 2016-11-30 DIAGNOSIS — Z0489 Encounter for examination and observation for other specified reasons: Secondary | ICD-10-CM

## 2016-11-30 NOTE — Progress Notes (Signed)
Anxiety x 2 days No TDAP today per pt request, wants NV

## 2016-11-30 NOTE — Progress Notes (Signed)
Patient says increased dose of zoloft is helping and prefers to continue with that for now. F/U scan today for growth and placentation- baby is 3#13oz at 74% with AC at 97%. AFI WNL. Placenta is 2.1-2.25 cm from cervix. Repeat placentation/growth at 34 weeks.

## 2016-12-16 ENCOUNTER — Ambulatory Visit (INDEPENDENT_AMBULATORY_CARE_PROVIDER_SITE_OTHER): Payer: Federal, State, Local not specified - PPO | Admitting: Obstetrics & Gynecology

## 2016-12-16 DIAGNOSIS — O99212 Obesity complicating pregnancy, second trimester: Secondary | ICD-10-CM

## 2016-12-16 DIAGNOSIS — Z23 Encounter for immunization: Secondary | ICD-10-CM

## 2016-12-16 DIAGNOSIS — O0992 Supervision of high risk pregnancy, unspecified, second trimester: Secondary | ICD-10-CM

## 2016-12-16 NOTE — Patient Instructions (Signed)
Third Trimester of Pregnancy The third trimester is from week 28 through week 40 (months 7 through 9). The third trimester is a time when the unborn baby (fetus) is growing rapidly. At the end of the ninth month, the fetus is about 20 inches in length and weighs 6-10 pounds. Body changes during your third trimester Your body will continue to go through many changes during pregnancy. The changes vary from woman to woman. During the third trimester:  Your weight will continue to increase. You can expect to gain 25-35 pounds (11-16 kg) by the end of the pregnancy.  You may begin to get stretch marks on your hips, abdomen, and breasts.  You may urinate more often because the fetus is moving lower into your pelvis and pressing on your bladder.  You may develop or continue to have heartburn. This is caused by increased hormones that slow down muscles in the digestive tract.  You may develop or continue to have constipation because increased hormones slow digestion and cause the muscles that push waste through your intestines to relax.  You may develop hemorrhoids. These are swollen veins (varicose veins) in the rectum that can itch or be painful.  You may develop swollen, bulging veins (varicose veins) in your legs.  You may have increased body aches in the pelvis, back, or thighs. This is due to weight gain and increased hormones that are relaxing your joints.  You may have changes in your hair. These can include thickening of your hair, rapid growth, and changes in texture. Some women also have hair loss during or after pregnancy, or hair that feels dry or thin. Your hair will most likely return to normal after your baby is born.  Your breasts will continue to grow and they will continue to become tender. A yellow fluid (colostrum) may leak from your breasts. This is the first milk you are producing for your baby.  Your belly button may stick out.  You may notice more swelling in your hands,  face, or ankles.  You may have increased tingling or numbness in your hands, arms, and legs. The skin on your belly may also feel numb.  You may feel short of breath because of your expanding uterus.  You may have more problems sleeping. This can be caused by the size of your belly, increased need to urinate, and an increase in your body's metabolism.  You may notice the fetus "dropping," or moving lower in your abdomen (lightening).  You may have increased vaginal discharge.  You may notice your joints feel loose and you may have pain around your pelvic bone.  What to expect at prenatal visits You will have prenatal exams every 2 weeks until week 36. Then you will have weekly prenatal exams. During a routine prenatal visit:  You will be weighed to make sure you and the baby are growing normally.  Your blood pressure will be taken.  Your abdomen will be measured to track your baby's growth.  The fetal heartbeat will be listened to.  Any test results from the previous visit will be discussed.  You may have a cervical check near your due date to see if your cervix has softened or thinned (effaced).  You will be tested for Group B streptococcus. This happens between 35 and 37 weeks.  Your health care provider may ask you:  What your birth plan is.  How you are feeling.  If you are feeling the baby move.  If you have had   any abnormal symptoms, such as leaking fluid, bleeding, severe headaches, or abdominal cramping.  If you are using any tobacco products, including cigarettes, chewing tobacco, and electronic cigarettes.  If you have any questions.  Other tests or screenings that may be performed during your third trimester include:  Blood tests that check for low iron levels (anemia).  Fetal testing to check the health, activity level, and growth of the fetus. Testing is done if you have certain medical conditions or if there are problems during the  pregnancy.  Nonstress test (NST). This test checks the health of your baby to make sure there are no signs of problems, such as the baby not getting enough oxygen. During this test, a belt is placed around your belly. The baby is made to move, and its heart rate is monitored during movement.  What is false labor? False labor is a condition in which you feel small, irregular tightenings of the muscles in the womb (contractions) that usually go away with rest, changing position, or drinking water. These are called Braxton Hicks contractions. Contractions may last for hours, days, or even weeks before true labor sets in. If contractions come at regular intervals, become more frequent, increase in intensity, or become painful, you should see your health care provider. What are the signs of labor?  Abdominal cramps.  Regular contractions that start at 10 minutes apart and become stronger and more frequent with time.  Contractions that start on the top of the uterus and spread down to the lower abdomen and back.  Increased pelvic pressure and dull back pain.  A watery or bloody mucus discharge that comes from the vagina.  Leaking of amniotic fluid. This is also known as your "water breaking." It could be a slow trickle or a gush. Let your health care provider know if it has a color or strange odor. If you have any of these signs, call your health care provider right away, even if it is before your due date. Follow these instructions at home: Medicines  Follow your health care provider's instructions regarding medicine use. Specific medicines may be either safe or unsafe to take during pregnancy.  Take a prenatal vitamin that contains at least 600 micrograms (mcg) of folic acid.  If you develop constipation, try taking a stool softener if your health care provider approves. Eating and drinking  Eat a balanced diet that includes fresh fruits and vegetables, whole grains, good sources of protein  such as meat, eggs, or tofu, and low-fat dairy. Your health care provider will help you determine the amount of weight gain that is right for you.  Avoid raw meat and uncooked cheese. These carry germs that can cause birth defects in the baby.  If you have low calcium intake from food, talk to your health care provider about whether you should take a daily calcium supplement.  Eat four or five small meals rather than three large meals a day.  Limit foods that are high in fat and processed sugars, such as fried and sweet foods.  To prevent constipation: ? Drink enough fluid to keep your urine clear or pale yellow. ? Eat foods that are high in fiber, such as fresh fruits and vegetables, whole grains, and beans. Activity  Exercise only as directed by your health care provider. Most women can continue their usual exercise routine during pregnancy. Try to exercise for 30 minutes at least 5 days a week. Stop exercising if you experience uterine contractions.  Avoid heavy   lifting.  Do not exercise in extreme heat or humidity, or at high altitudes.  Wear low-heel, comfortable shoes.  Practice good posture.  You may continue to have sex unless your health care provider tells you otherwise. Relieving pain and discomfort  Take frequent breaks and rest with your legs elevated if you have leg cramps or low back pain.  Take warm sitz baths to soothe any pain or discomfort caused by hemorrhoids. Use hemorrhoid cream if your health care provider approves.  Wear a good support bra to prevent discomfort from breast tenderness.  If you develop varicose veins: ? Wear support pantyhose or compression stockings as told by your healthcare provider. ? Elevate your feet for 15 minutes, 3-4 times a day. Prenatal care  Write down your questions. Take them to your prenatal visits.  Keep all your prenatal visits as told by your health care provider. This is important. Safety  Wear your seat belt at  all times when driving.  Make a list of emergency phone numbers, including numbers for family, friends, the hospital, and police and fire departments. General instructions  Avoid cat litter boxes and soil used by cats. These carry germs that can cause birth defects in the baby. If you have a cat, ask someone to clean the litter box for you.  Do not travel far distances unless it is absolutely necessary and only with the approval of your health care provider.  Do not use hot tubs, steam rooms, or saunas.  Do not drink alcohol.  Do not use any products that contain nicotine or tobacco, such as cigarettes and e-cigarettes. If you need help quitting, ask your health care provider.  Do not use any medicinal herbs or unprescribed drugs. These chemicals affect the formation and growth of the baby.  Do not douche or use tampons or scented sanitary pads.  Do not cross your legs for long periods of time.  To prepare for the arrival of your baby: ? Take prenatal classes to understand, practice, and ask questions about labor and delivery. ? Make a trial run to the hospital. ? Visit the hospital and tour the maternity area. ? Arrange for maternity or paternity leave through employers. ? Arrange for family and friends to take care of pets while you are in the hospital. ? Purchase a rear-facing car seat and make sure you know how to install it in your car. ? Pack your hospital bag. ? Prepare the baby's nursery. Make sure to remove all pillows and stuffed animals from the baby's crib to prevent suffocation.  Visit your dentist if you have not gone during your pregnancy. Use a soft toothbrush to brush your teeth and be gentle when you floss. Contact a health care provider if:  You are unsure if you are in labor or if your water has broken.  You become dizzy.  You have mild pelvic cramps, pelvic pressure, or nagging pain in your abdominal area.  You have lower back pain.  You have persistent  nausea, vomiting, or diarrhea.  You have an unusual or bad smelling vaginal discharge.  You have pain when you urinate. Get help right away if:  Your water breaks before 37 weeks.  You have regular contractions less than 5 minutes apart before 37 weeks.  You have a fever.  You are leaking fluid from your vagina.  You have spotting or bleeding from your vagina.  You have severe abdominal pain or cramping.  You have rapid weight loss or weight gain.    You have shortness of breath with chest pain.  You notice sudden or extreme swelling of your face, hands, ankles, feet, or legs.  Your baby makes fewer than 10 movements in 2 hours.  You have severe headaches that do not go away when you take medicine.  You have vision changes. Summary  The third trimester is from week 28 through week 40, months 7 through 9. The third trimester is a time when the unborn baby (fetus) is growing rapidly.  During the third trimester, your discomfort may increase as you and your baby continue to gain weight. You may have abdominal, leg, and back pain, sleeping problems, and an increased need to urinate.  During the third trimester your breasts will keep growing and they will continue to become tender. A yellow fluid (colostrum) may leak from your breasts. This is the first milk you are producing for your baby.  False labor is a condition in which you feel small, irregular tightenings of the muscles in the womb (contractions) that eventually go away. These are called Braxton Hicks contractions. Contractions may last for hours, days, or even weeks before true labor sets in.  Signs of labor can include: abdominal cramps; regular contractions that start at 10 minutes apart and become stronger and more frequent with time; watery or bloody mucus discharge that comes from the vagina; increased pelvic pressure and dull back pain; and leaking of amniotic fluid. This information is not intended to replace advice  given to you by your health care provider. Make sure you discuss any questions you have with your health care provider. Document Released: 06/14/2001 Document Revised: 11/26/2015 Document Reviewed: 08/21/2012 Elsevier Interactive Patient Education  2017 [employer]  

## 2016-12-16 NOTE — Progress Notes (Signed)
PNV, [place], PTL precautions, TDaP, [place] nv for placenta and growth

## 2016-12-16 NOTE — Addendum Note (Signed)
Addended by: Quintella Baton D on: 12/16/2016 04:57 PM   Modules accepted: Orders

## 2016-12-27 DIAGNOSIS — F4322 Adjustment disorder with anxiety: Secondary | ICD-10-CM | POA: Diagnosis not present

## 2017-01-02 ENCOUNTER — Ambulatory Visit (INDEPENDENT_AMBULATORY_CARE_PROVIDER_SITE_OTHER): Payer: Federal, State, Local not specified - PPO

## 2017-01-02 ENCOUNTER — Ambulatory Visit (INDEPENDENT_AMBULATORY_CARE_PROVIDER_SITE_OTHER): Payer: Federal, State, Local not specified - PPO | Admitting: Obstetrics & Gynecology

## 2017-01-02 VITALS — BP 100/70 | Wt 232.0 lb

## 2017-01-02 DIAGNOSIS — O0992 Supervision of high risk pregnancy, unspecified, second trimester: Secondary | ICD-10-CM

## 2017-01-02 DIAGNOSIS — Z6832 Body mass index (BMI) 32.0-32.9, adult: Secondary | ICD-10-CM

## 2017-01-02 DIAGNOSIS — O99212 Obesity complicating pregnancy, second trimester: Secondary | ICD-10-CM

## 2017-01-02 DIAGNOSIS — Z3A34 34 weeks gestation of pregnancy: Secondary | ICD-10-CM

## 2017-01-02 NOTE — Progress Notes (Signed)
[place] discussed.  Review of ULTRASOUND.    I have personally reviewed images and report of recent ultrasound done at [facility].    Plan of management to be discussed with patient. Placenta not low lying. Growth normal.   Concern for possible fetal heart rate arrhythmia, referral considered for DP.

## 2017-01-05 ENCOUNTER — Other Ambulatory Visit: Payer: Self-pay | Admitting: *Deleted

## 2017-01-05 ENCOUNTER — Ambulatory Visit
Admission: RE | Admit: 2017-01-05 | Discharge: 2017-01-05 | Disposition: A | Discharge status: Home / Self Care | Payer: Federal, State, Local not specified - PPO | Source: Ambulatory Visit | Attending: Maternal & Fetal Medicine | Admitting: Maternal & Fetal Medicine

## 2017-01-05 DIAGNOSIS — Z3A34 34 weeks gestation of pregnancy: Secondary | ICD-10-CM | POA: Diagnosis not present

## 2017-01-05 DIAGNOSIS — O368390 Maternal care for abnormalities of the fetal heart rate or rhythm, unspecified trimester, not applicable or unspecified: Secondary | ICD-10-CM | POA: Insufficient documentation

## 2017-01-05 DIAGNOSIS — I491 Atrial premature depolarization: Secondary | ICD-10-CM | POA: Insufficient documentation

## 2017-01-17 ENCOUNTER — Ambulatory Visit (INDEPENDENT_AMBULATORY_CARE_PROVIDER_SITE_OTHER): Payer: Federal, State, Local not specified - PPO | Admitting: Obstetrics and Gynecology

## 2017-01-17 VITALS — BP 104/70 | Wt 230.0 lb

## 2017-01-17 DIAGNOSIS — O0993 Supervision of high risk pregnancy, unspecified, third trimester: Secondary | ICD-10-CM

## 2017-01-17 DIAGNOSIS — O0992 Supervision of high risk pregnancy, unspecified, second trimester: Secondary | ICD-10-CM | POA: Diagnosis not present

## 2017-01-17 DIAGNOSIS — O9989 Other specified diseases and conditions complicating pregnancy, childbirth and the puerperium: Secondary | ICD-10-CM

## 2017-01-17 DIAGNOSIS — O99212 Obesity complicating pregnancy, second trimester: Secondary | ICD-10-CM

## 2017-01-17 DIAGNOSIS — Z3A36 36 weeks gestation of pregnancy: Secondary | ICD-10-CM

## 2017-01-17 DIAGNOSIS — O09899 Supervision of other high risk pregnancies, unspecified trimester: Secondary | ICD-10-CM

## 2017-01-17 DIAGNOSIS — Z283 Underimmunization status: Secondary | ICD-10-CM

## 2017-01-17 DIAGNOSIS — Z2839 Other underimmunization status: Secondary | ICD-10-CM

## 2017-01-17 DIAGNOSIS — B951 Streptococcus, group B, as the cause of diseases classified elsewhere: Secondary | ICD-10-CM

## 2017-01-17 DIAGNOSIS — O09512 Supervision of elderly primigravida, second trimester: Secondary | ICD-10-CM

## 2017-01-17 DIAGNOSIS — Z6832 Body mass index (BMI) 32.0-32.9, adult: Secondary | ICD-10-CM

## 2017-01-17 NOTE — Progress Notes (Signed)
Prenatal Visit Note Date: 01/17/2017 Clinic: [facility]  Subjective:  Amanda Stout is a 41 y.o. G1P0 at [redacted]w[redacted]d being seen today for ongoing prenatal care.  She is currently monitored for the following issues for this high-risk pregnancy and has Perennial allergic rhinitis; Migraine with typical aura; MDD (major depressive disorder), recurrent episode, moderate ([place]); Lower back pain; Lactose intolerance in adult; Complete tear of right ACL; Myalgia; Anxiety attack; Rubella non-immune status, antepartum; Group beta Strep positive; Supervision of high-risk pregnancy, second trimester; Obesity complicating pregnancy, second trimester; BMI 32.0-32.9,adult; Elderly primigravida in second trimester; Exposure to [place] virus; Dizziness; and Fetal arrhythmia before the onset of labor on her problem list.  Patient reports no bleeding, no contractions and no cramping.   Contractions: Irregular. Vag. Bleeding: None.  Movement: Present. Denies leaking of fluid.   The following portions of the patient's history were reviewed and updated as appropriate: allergies, current medications, past family history, past medical history, past social history, past surgical history and problem list. Problem list updated.  Objective:   Vitals:   01/17/17 0922  BP: 104/70  Weight: 230 lb (104.3 kg)    Fetal Status: Fetal Heart Rate (bpm): 150 Fundal Height: 37 cm Movement: Present     General:  Alert, oriented and cooperative. Patient is in no acute distress.  Skin: Skin is warm and dry. No rash noted.   Cardiovascular: Normal heart rate noted  Respiratory: Normal respiratory effort, no problems with respiration noted  Abdomen: Soft, gravid, appropriate for gestational age. Pain/Pressure: Present     Pelvic:  Cervical exam performed Dilation: Closed Effacement (%): 50 Station: -3  Extremities: Normal range of motion.     Mental Status: Normal mood and affect. Normal behavior. Normal judgment and thought content.    Urinalysis:      Assessment and Plan:  Pregnancy: G1P0 at [redacted]w[redacted]d  1. Supervision of high-risk pregnancy, second trimester - Strep Gp B NAA - GC/Chlamydia Probe Amp 2. Rubella non-immune status, antepartum 3. Obesity complicating pregnancy, second trimester 4. Group beta Strep positive 5. Elderly primigravida in second trimester 6. BMI 32.0-32.9,adult 7. [redacted] weeks gestation of pregnancy  Preterm labor symptoms and general obstetric precautions including but not limited to vaginal bleeding, contractions, leaking of fluid and fetal movement were reviewed in detail with the patient. Please refer to After Visit Summary for other counseling recommendations.  Return in about 1 week (around 01/24/2017) for Routine Prenatal Appointment.   Prentice Docker, MD 01/17/2017 10:07 AM

## 2017-01-19 LAB — GC/CHLAMYDIA PROBE AMP
Chlamydia trachomatis, NAA: NEGATIVE
NEISSERIA GONORRHOEAE BY PCR: NEGATIVE

## 2017-01-19 LAB — STREP GP B NAA: Strep Gp B NAA: POSITIVE — AB

## 2017-01-23 ENCOUNTER — Other Ambulatory Visit: Payer: Self-pay | Admitting: Obstetrics and Gynecology

## 2017-01-23 DIAGNOSIS — F419 Anxiety disorder, unspecified: Secondary | ICD-10-CM

## 2017-01-25 ENCOUNTER — Ambulatory Visit (INDEPENDENT_AMBULATORY_CARE_PROVIDER_SITE_OTHER): Payer: Federal, State, Local not specified - PPO | Admitting: Advanced Practice Midwife

## 2017-01-25 VITALS — BP 104/66 | Wt 235.0 lb

## 2017-01-25 DIAGNOSIS — Z3A37 37 weeks gestation of pregnancy: Secondary | ICD-10-CM

## 2017-01-25 NOTE — Progress Notes (Signed)
Hasn't had any contractions in the past week. C/o hands and feet swelling and low back pain. Comfort measures reviewed including acupuncture for possible relief of back pain and stimulating labor if she goes postdates. Denies HA, vision changes, epigastric pain. Return to clinic in 1 week for ROB.

## 2017-01-25 NOTE — Patient Instructions (Signed)
Vaginal Delivery Vaginal delivery means that you will give birth by pushing your baby out of your birth canal (vagina). A team of health care providers will help you before, during, and after vaginal delivery. Birth experiences are unique for every woman and every pregnancy, and birth experiences vary depending on where you choose to give birth. What should I do to prepare for my baby's birth? Before your baby is born, it is important to talk with your health care provider about:  Your labor and delivery preferences. These may include: ? Medicines that you may be given. ? How you will manage your pain. This might include non-medical pain relief techniques or injectable pain relief such as epidural analgesia. ? How you and your baby will be monitored during labor and delivery. ? Who may be in the labor and delivery room with you. ? Your feelings about surgical delivery of your baby (cesarean delivery, or C-section) if this becomes necessary. ? Your feelings about receiving donated blood through an IV tube (blood transfusion) if this becomes necessary.  Whether you are able: ? To take pictures or videos of the birth. ? To eat during labor and delivery. ? To move around, walk, or change positions during labor and delivery.  What to expect after your baby is born, such as: ? Whether delayed umbilical cord clamping and cutting is offered. ? Who will care for your baby right after birth. ? Medicines or tests that may be recommended for your baby. ? Whether breastfeeding is supported in your hospital or birth center. ? How long you will be in the hospital or birth center.  How any medical conditions you have may affect your baby or your labor and delivery experience.  To prepare for your baby's birth, you should also:  Attend all of your health care visits before delivery (prenatal visits) as recommended by your health care provider. This is important.  Prepare your home for your baby's  arrival. Make sure that you have: ? Diapers. ? Baby clothing. ? Feeding equipment. ? Safe sleeping arrangements for you and your baby.  Install a car seat in your vehicle. Have your car seat checked by a certified car seat installer to make sure that it is installed safely.  Think about who will help you with your new baby at home for at least the first several weeks after delivery.  What can I expect when I arrive at the birth center or hospital? Once you are in labor and have been admitted into the hospital or birth center, your health care provider may:  Review your pregnancy history and any concerns you have.  Insert an IV tube into one of your veins. This is used to give you fluids and medicines.  Check your blood pressure, pulse, temperature, and heart rate (vital signs).  Check whether your bag of water (amniotic sac) has broken (ruptured).  Talk with you about your birth plan and discuss pain control options.  Monitoring Your health care provider may monitor your contractions (uterine monitoring) and your baby's heart rate (fetal monitoring). You may need to be monitored:  Often, but not continuously (intermittently).  All the time or for long periods at a time (continuously). Continuous monitoring may be needed if: ? You are taking certain medicines, such as medicine to relieve pain or make your contractions stronger. ? You have pregnancy or labor complications.  Monitoring may be done by:  Placing a special stethoscope or a handheld monitoring device on your abdomen to   check your baby's heartbeat, and feeling your abdomen for contractions. This method of monitoring does not continuously record your baby's heartbeat or your contractions.  Placing monitors on your abdomen (external monitors) to record your baby's heartbeat and the frequency and length of contractions. You may not have to wear external monitors all the time.  Placing monitors inside of your uterus  (internal monitors) to record your baby's heartbeat and the frequency, length, and strength of your contractions. ? Your health care provider may use internal monitors if he or she needs more information about the strength of your contractions or your baby's heart rate. ? Internal monitors are put in place by passing a thin, flexible wire through your vagina and into your uterus. Depending on the type of monitor, it may remain in your uterus or on your baby's head until birth. ? Your health care provider will discuss the benefits and risks of internal monitoring with you and will ask for your permission before inserting the monitors.  Telemetry. This is a type of continuous monitoring that can be done with external or internal monitors. Instead of having to stay in bed, you are able to move around during telemetry. Ask your health care provider if telemetry is an option for you.  Physical exam Your health care provider may perform a physical exam. This may include:  Checking whether your baby is positioned: ? With the head toward your vagina (head-down). This is most common. ? With the head toward the top of your uterus (head-up or breech). If your baby is in a breech position, your health care provider may try to turn your baby to a head-down position so you can deliver vaginally. If it does not seem that your baby can be born vaginally, your provider may recommend surgery to deliver your baby. In rare cases, you may be able to deliver vaginally if your baby is head-up (breech delivery). ? Lying sideways (transverse). Babies that are lying sideways cannot be delivered vaginally.  Checking your cervix to determine: ? Whether it is thinning out (effacing). ? Whether it is opening up (dilating). ? How low your baby has moved into your birth canal.  What are the three stages of labor and delivery?  Normal labor and delivery is divided into the following three stages: Stage 1  Stage 1 is the  longest stage of labor, and it can last for hours or days. Stage 1 includes: ? Early labor. This is when contractions may be irregular, or regular and mild. Generally, early labor contractions are more than 10 minutes apart. ? Active labor. This is when contractions get longer, more regular, more frequent, and more intense. ? The transition phase. This is when contractions happen very close together, are very intense, and may last longer than during any other part of labor.  Contractions generally feel mild, infrequent, and irregular at first. They get stronger, more frequent (about every 2-3 minutes), and more regular as you progress from early labor through active labor and transition.  Many women progress through stage 1 naturally, but you may need help to continue making progress. If this happens, your health care provider may talk with you about: ? Rupturing your amniotic sac if it has not ruptured yet. ? Giving you medicine to help make your contractions stronger and more frequent.  Stage 1 ends when your cervix is completely dilated to 4 inches (10 cm) and completely effaced. This happens at the end of the transition phase. Stage 2  Once   your cervix is completely effaced and dilated to 4 inches (10 cm), you may start to feel an urge to push. It is common for the body to naturally take a rest before feeling the urge to push, especially if you received an epidural or certain other pain medicines. This rest period may last for up to 1-2 hours, depending on your unique labor experience.  During stage 2, contractions are generally less painful, because pushing helps relieve contraction pain. Instead of contraction pain, you may feel stretching and burning pain, especially when the widest part of your baby's head passes through the vaginal opening (crowning).  Your health care provider will closely monitor your pushing progress and your baby's progress through the vagina during stage 2.  Your  health care provider may massage the area of skin between your vaginal opening and anus (perineum) or apply warm compresses to your perineum. This helps it stretch as the baby's head starts to crown, which can help prevent perineal tearing. ? In some cases, an incision may be made in your perineum (episiotomy) to allow the baby to pass through the vaginal opening. An episiotomy helps to make the opening of the vagina larger to allow more room for the baby to fit through.  It is very important to breathe and focus so your health care provider can control the delivery of your baby's head. Your health care provider may have you decrease the intensity of your pushing, to help prevent perineal tearing.  After delivery of your baby's head, the shoulders and the rest of the body generally deliver very quickly and without difficulty.  Once your baby is delivered, the umbilical cord may be cut right away, or this may be delayed for 1-2 minutes, depending on your baby's health. This may vary among health care providers, hospitals, and birth centers.  If you and your baby are healthy enough, your baby may be placed on your chest or abdomen to help maintain the baby's temperature and to help you bond with each other. Some mothers and babies start breastfeeding at this time. Your health care team will dry your baby and help keep your baby warm during this time.  Your baby may need immediate care if he or she: ? Showed signs of distress during labor. ? Has a medical condition. ? Was born too early (prematurely). ? Had a bowel movement before birth (meconium). ? Shows signs of difficulty transitioning from being inside the uterus to being outside of the uterus. If you are planning to breastfeed, your health care team will help you begin a feeding. Stage 3  The third stage of labor starts immediately after the birth of your baby and ends after you deliver the placenta. The placenta is an organ that develops  during pregnancy to provide oxygen and nutrients to your baby in the womb.  Delivering the placenta may require some pushing, and you may have mild contractions. Breastfeeding can stimulate contractions to help you deliver the placenta.  After the placenta is delivered, your uterus should tighten (contract) and become firm. This helps to stop bleeding in your uterus. To help your uterus contract and to control bleeding, your health care provider may: ? Give you medicine by injection, through an IV tube, by mouth, or through your rectum (rectally). ? Massage your abdomen or perform a vaginal exam to remove any blood clots that are left in your uterus. ? Empty your bladder by placing a thin, flexible tube (catheter) into your bladder. ? Encourage   you to breastfeed your baby. After labor is over, you and your baby will be monitored closely to ensure that you are both healthy until you are ready to go home. Your health care team will teach you how to care for yourself and your baby. This information is not intended to replace advice given to you by your health care provider. Make sure you discuss any questions you have with your health care provider. Document Released: 03/29/2008 Document Revised: 01/08/2016 Document Reviewed: 07/05/2015 Elsevier Interactive Patient Education  2018 [employer]  

## 2017-01-25 NOTE — Progress Notes (Signed)
Back pain Feet swelling

## 2017-01-27 ENCOUNTER — Inpatient Hospital Stay
Admission: EM | Admit: 2017-01-27 | Discharge: 2017-01-31 | DRG: 775 | Disposition: A | Discharge status: Home / Self Care | Payer: Federal, State, Local not specified - PPO | Attending: Advanced Practice Midwife | Admitting: Advanced Practice Midwife

## 2017-01-27 ENCOUNTER — Telehealth: Payer: Self-pay

## 2017-01-27 DIAGNOSIS — O9081 Anemia of the puerperium: Secondary | ICD-10-CM | POA: Diagnosis not present

## 2017-01-27 DIAGNOSIS — O4292 Full-term premature rupture of membranes, unspecified as to length of time between rupture and onset of labor: Principal | ICD-10-CM | POA: Diagnosis present

## 2017-01-27 DIAGNOSIS — O99344 Other mental disorders complicating childbirth: Secondary | ICD-10-CM | POA: Diagnosis not present

## 2017-01-27 DIAGNOSIS — Z3A38 38 weeks gestation of pregnancy: Secondary | ICD-10-CM | POA: Diagnosis not present

## 2017-01-27 DIAGNOSIS — F329 Major depressive disorder, single episode, unspecified: Secondary | ICD-10-CM | POA: Diagnosis not present

## 2017-01-27 DIAGNOSIS — D62 Acute posthemorrhagic anemia: Secondary | ICD-10-CM | POA: Diagnosis not present

## 2017-01-27 DIAGNOSIS — O0993 Supervision of high risk pregnancy, unspecified, third trimester: Secondary | ICD-10-CM

## 2017-01-27 DIAGNOSIS — O99824 Streptococcus B carrier state complicating childbirth: Secondary | ICD-10-CM | POA: Diagnosis present

## 2017-01-27 LAB — CBC
HEMATOCRIT: 33.9 % — AB (ref 35.0–47.0)
HEMOGLOBIN: 11.8 g/dL — AB (ref 12.0–16.0)
MCH: 32.1 pg (ref 26.0–34.0)
MCHC: 34.9 g/dL (ref 32.0–36.0)
MCV: 92 fL (ref 80.0–100.0)
Platelets: 247 10*3/uL (ref 150–440)
RBC: 3.69 MIL/uL — AB (ref 3.80–5.20)
RDW: 14 % (ref 11.5–14.5)
WBC: 10.7 10*3/uL (ref 3.6–11.0)

## 2017-01-27 LAB — TYPE AND SCREEN
ABO/RH(D): O POS
ANTIBODY SCREEN: NEGATIVE

## 2017-01-27 MED ORDER — OXYTOCIN 40 UNITS IN LACTATED RINGERS INFUSION - SIMPLE MED: 2.5000 [IU]/h | INTRAVENOUS | Status: DC

## 2017-01-27 MED ORDER — LIDOCAINE HCL (PF) 1 % IJ SOLN
INTRAMUSCULAR | Status: AC
  Filled 2017-01-27: qty 30

## 2017-01-27 MED ORDER — OXYTOCIN 10 UNIT/ML IJ SOLN
INTRAMUSCULAR | Status: AC
  Filled 2017-01-27: qty 2

## 2017-01-27 MED ORDER — AMMONIA AROMATIC IN INHA
RESPIRATORY_TRACT | Status: AC
  Filled 2017-01-27: qty 10

## 2017-01-27 MED ORDER — BUTORPHANOL TARTRATE 1 MG/ML IJ SOLN
1.0000 mg | INTRAMUSCULAR | Status: DC | PRN
  Administered 2017-01-28 (×2): 1 mg via INTRAVENOUS
  Filled 2017-01-27: qty 1

## 2017-01-27 MED ORDER — OXYTOCIN BOLUS FROM INFUSION
500.0000 mL | Freq: Once | INTRAVENOUS | Status: AC
  Administered 2017-01-29: 500 mL via INTRAVENOUS

## 2017-01-27 MED ORDER — PENICILLIN G POT IN DEXTROSE 60000 UNIT/ML IV SOLN
3.0000 10*6.[IU] | INTRAVENOUS | Status: DC
  Administered 2017-01-27 – 2017-01-29 (×4): 3 10*6.[IU] via INTRAVENOUS
  Filled 2017-01-27 (×14): qty 50

## 2017-01-27 MED ORDER — ONDANSETRON HCL 4 MG/2ML IJ SOLN
4.0000 mg | Freq: Four times a day (QID) | INTRAMUSCULAR | Status: DC | PRN
Start: 2017-01-27 — End: 2017-01-29
  Administered 2017-01-28: 4 mg via INTRAVENOUS
  Filled 2017-01-27: qty 2

## 2017-01-27 MED ORDER — LACTATED RINGERS IV SOLN
INTRAVENOUS | Status: DC
  Administered 2017-01-27: 16:00:00 via INTRAVENOUS

## 2017-01-27 MED ORDER — OXYTOCIN 40 UNITS IN LACTATED RINGERS INFUSION - SIMPLE MED
1.0000 m[IU]/min | INTRAVENOUS | Status: DC
  Administered 2017-01-27: 2 m[IU]/min via INTRAVENOUS
  Filled 2017-01-27: qty 1000

## 2017-01-27 MED ORDER — PENICILLIN G POTASSIUM 5000000 UNITS IJ SOLR
5.0000 10*6.[IU] | Freq: Once | INTRAVENOUS | Status: AC
  Administered 2017-01-27: 5 10*6.[IU] via INTRAVENOUS
  Filled 2017-01-27: qty 5

## 2017-01-27 MED ORDER — ACETAMINOPHEN 325 MG PO TABS
650.0000 mg | ORAL_TABLET | ORAL | Status: DC | PRN
  Administered 2017-01-28: 650 mg via ORAL
  Filled 2017-01-27: qty 2

## 2017-01-27 MED ORDER — MISOPROSTOL 200 MCG PO TABS
ORAL_TABLET | ORAL | Status: AC
  Filled 2017-01-27: qty 4

## 2017-01-27 MED ORDER — TERBUTALINE SULFATE 1 MG/ML IJ SOLN: 0.2500 mg | Freq: Once | INTRAMUSCULAR | Status: DC | PRN

## 2017-01-27 MED ORDER — LACTATED RINGERS IV SOLN: 500.0000 mL | INTRAVENOUS | Status: DC | PRN

## 2017-01-27 NOTE — Telephone Encounter (Signed)
FMLA/DISABILITY form for [employer] filled out and given to [place] for processing.

## 2017-01-27 NOTE — OB Triage Note (Addendum)
Pt G1P0 [redacted]w[redacted]d complains of rupture of membranes at 1245 on 01/27/17. Pt states it is pink tinged with no odor. Nitrazine negative. Pt states no ctx, + FM, and denies vaginal bleeding. Pt rates pain 0/10. VSS. Monitors applied and assessing.

## 2017-01-27 NOTE — H&P (Signed)
Obstetrics Admission History & Physical   Rupture of Membranes   HPI:  41 y.o. G1P0 @ [redacted]w[redacted]d (02/09/2017, by Ultrasound). Admitted on 01/27/2017:   Patient Active Problem List   Diagnosis Date Noted  . Normal labor and delivery 01/27/2017  . Fetal arrhythmia before the onset of labor 01/02/2017  . Dizziness 11/01/2016  . Exposure to [place] virus 09/29/2016  . Supervision of high risk pregnancy, antepartum, third trimester 09/26/2016  . Obesity complicating pregnancy, second trimester 09/26/2016  . BMI 32.0-32.9,adult 09/26/2016  . Elderly primigravida in second trimester 09/26/2016  . Group beta Strep positive 07/11/2016  . Rubella non-immune status, antepartum 07/08/2016  . Anxiety attack 03/09/2016  . Myalgia 01/21/2016  . Complete tear of right ACL 04/10/2015  . Perennial allergic rhinitis   . Migraine with typical aura   . MDD (major depressive disorder), recurrent episode, moderate ([place])   . Lower back pain   . Lactose intolerance in adult      Presents for LOF at 1245. She has had mild cramping since that time.  Pink d/c as well.  No BRB.    P[facility]atal care at: at [facility]. Pregnancy complicated by none.  ROS: A review of systems was performed and negative, except as stated in the above HPI. PMHx:  Past Medical History:  Diagnosis Date  . ACL injury tear 04/2015   right  . Arthritis    L4-5  . Chronic migraine   . Exposure to [place] virus 09/29/2016   Zika NAA urine serum done  . Lower back pain 2010   s/p herniated disc  . Perennial allergic rhinitis    PSHx:  Past Surgical History:  Procedure Laterality Date  . KNEE ARTHROSCOPY WITH ANTERIOR CRUCIATE LIGAMENT (ACL) REPAIR Right 04/10/2015   Marchia Bond, MD;  [place]  . WISDOM TOOTH EXTRACTION  1998   Medications:  Prescriptions Prior to Admission  Medication Sig Dispense Refill Last Dose  . acetaminophen (TYLENOL) 500 MG tablet Take 500 mg by mouth every 6 (six) hours as needed.   01/27/2017 at  Unknown time  . butalbital-acetaminophen-caffeine (FIORICET, ESGIC) 50-325-40 MG tablet Take 1 tablet by mouth every 6 (six) hours as needed for headache. 30 tablet 1 01/27/2017 at Unknown time  . Ferrous Fumarate (HEMOCYTE - 106 MG FE) 324 (106 Fe) MG TABS tablet Take 1 tablet by mouth.   01/27/2017 at Unknown time  . Prenatal Multivit-Min-Fe-FA (PRE-NATAL PO) Take by mouth.   01/26/2017 at Unknown time  . sertraline (ZOLOFT) 100 MG tablet TAKE 1 TABLET (100 MG TOTAL) BY MOUTH DAILY. 30 tablet 3 01/27/2017 at Unknown time  . Misc. Devices (AMEDA PURELY YOURS BREAST PUMP) MISC    Unknown at Unknown time   Allergies: is allergic to lactose intolerance (gi). OBHx:  OB History  Gravida Para Term Preterm AB Living  1         0  SAB TAB Ectopic Multiple Live Births               # Outcome Date GA Lbr Len/2nd Weight Sex Delivery Anes PTL Lv  1 Current              TMA:UQJFHLKT/GYBWLSLHTDSK except as detailed in HPI.Marland Kitchen  No family history of birth defects. Soc Hx: Alcohol: none and Recreational drug use: none  Objective:   Vitals:   01/27/17 1400 01/27/17 1404  BP: 125/79   Pulse: 98   Resp:  18  Temp:  98.4 F (36.9 C)  Constitutional: Well nourished, well developed female in no acute distress.  HEENT: normal Skin: Warm and dry.  Cardiovascular:Regular rate and rhythm.   Extremity: trace to 1+ bilateral pedal edema Respiratory: Clear to auscultation bilateral. Normal respiratory effort Abdomen: moderate Back: no CVAT Neuro: DTRs 2+, Cranial nerves grossly intact Psych: Alert and Oriented x3. No memory deficits. Normal mood and affect.  MS: normal gait, normal bilateral lower extremity ROM/strength/stability.  Pelvic exam: is limited by body habitus EGBUS: within normal limits Vagina: within normal limits and with normal mucosa blood in the vault Cervix: 0.5 cm Uterus: Uterus demonstrates irritability pattern.  Adnexa: normal adnexa  EFM:FHR: 140 bpm, variability: moderate,   accelerations:  Present,  decelerations:  Absent Toco: Frequency: few times per hour   Perinatal info:  Blood type: O positive Rubella- Not immune Varicella -Immune TDaP Given during third trimester of this pregnancy RPR NR / HIV Neg/ HBsAg Neg   Assessment & Plan:   41 y.o. G1P0 @ [redacted]w[redacted]d, Admitted on 01/27/2017: SROM at 1245, monitor for labor Consider Pitocin if no s/sx active labor after 4-6 hours    Admit for labor, Antibiotics for GBS prophylaxis, Observe for cervical change, Fetal Wellbeing Reassuring and Epidural when ready  [name], MD, [facility], [facility] 01/27/2017  2:51 PM

## 2017-01-28 ENCOUNTER — Inpatient Hospital Stay: Payer: Federal, State, Local not specified - PPO | Admitting: Anesthesiology

## 2017-01-28 ENCOUNTER — Encounter: Payer: Self-pay | Admitting: Anesthesiology

## 2017-01-28 DIAGNOSIS — Z3A38 38 weeks gestation of pregnancy: Secondary | ICD-10-CM

## 2017-01-28 DIAGNOSIS — D62 Acute posthemorrhagic anemia: Secondary | ICD-10-CM

## 2017-01-28 DIAGNOSIS — O4292 Full-term premature rupture of membranes, unspecified as to length of time between rupture and onset of labor: Secondary | ICD-10-CM

## 2017-01-28 LAB — RPR: RPR: NONREACTIVE

## 2017-01-28 MED ORDER — PHENYLEPHRINE 40 MCG/ML (10ML) SYRINGE FOR IV PUSH (FOR BLOOD PRESSURE SUPPORT)
80.0000 ug | PREFILLED_SYRINGE | INTRAVENOUS | Status: DC | PRN
  Filled 2017-01-28: qty 5

## 2017-01-28 MED ORDER — FENTANYL 2.5 MCG/ML W/ROPIVACAINE 0.15% IN NS 100 ML EPIDURAL (ARMC)
12.0000 mL/h | EPIDURAL | Status: DC
  Administered 2017-01-28: 10 mL/h via EPIDURAL
  Administered 2017-01-28: 12 mL/h via EPIDURAL
  Filled 2017-01-28 (×2): qty 100

## 2017-01-28 MED ORDER — EPHEDRINE 5 MG/ML INJ
10.0000 mg | INTRAVENOUS | Status: DC | PRN
  Filled 2017-01-28: qty 2

## 2017-01-28 MED ORDER — FENTANYL 2.5 MCG/ML W/ROPIVACAINE 0.15% IN NS 100 ML EPIDURAL (ARMC)
EPIDURAL | Status: AC
  Filled 2017-01-28: qty 100

## 2017-01-28 MED ORDER — DIPHENHYDRAMINE HCL 50 MG/ML IJ SOLN: 12.5000 mg | INTRAMUSCULAR | Status: DC | PRN

## 2017-01-28 MED ORDER — BUTALBITAL-APAP-CAFFEINE 50-325-40 MG PO TABS
ORAL_TABLET | ORAL | Status: AC
  Administered 2017-01-28: 13:00:00
  Filled 2017-01-28: qty 1

## 2017-01-28 MED ORDER — LACTATED RINGERS IV SOLN: 500.0000 mL | Freq: Once | INTRAVENOUS | Status: DC

## 2017-01-28 MED ORDER — BUTALBITAL-APAP-CAFFEINE 50-325-40 MG PO TABS: 1.0000 | ORAL_TABLET | Freq: Once | ORAL | Status: DC

## 2017-01-28 MED ORDER — BUPIVACAINE HCL (PF) 0.25 % IJ SOLN
INTRAMUSCULAR | Status: DC | PRN
  Administered 2017-01-28 (×3): 5 mL via EPIDURAL

## 2017-01-28 NOTE — Progress Notes (Signed)
  Labor Progress Note   41 y.o. G1P0 @ [redacted]w[redacted]d , admitted for  Pregnancy, Labor Management. PROM  Subjective:  Patient is comfortable with epidural. She is having some nausea and is repositioned for fetal variables.   Objective:  BP 123/68   Pulse (!) 116   Temp 98.5 F (36.9 C) (Oral)   Resp 20   Ht 5\' 6"  (1.676 m)   Wt 235 lb (106.6 kg)   LMP 05/06/2016   BMI 37.93 kg/m  Abd: mild Extr: no edema SVE: deferred  EFM: FHR: 130 bpm, variability: moderate,  accelerations:  Present,  decelerations:  Present variable noted Toco: Frequency: Every 2-3 minutes Labs: I have reviewed the patient's lab results.   Assessment & Plan:  G1P0 @ [redacted]w[redacted]d, admitted for  Pregnancy and Labor/Delivery Management  1. Pain management: epidural. 2. FWB: FHT category II. Overall reassuring  3. ID: GBS positive 4. Labor management: SROM for 20 hours now, continue pitocin, Abx for GBS  All discussed with patient, see orders  Rod Can, [facility] 01/28/2017  9:29 AM

## 2017-01-28 NOTE — Anesthesia Procedure Notes (Signed)
Epidural Patient location during procedure: OB  Staffing Anesthesiologist: Mc Bloodworth Performed: anesthesiologist   Preanesthetic Checklist Completed: patient identified, site marked, surgical consent, pre-op evaluation, timeout performed, IV checked, risks and benefits discussed and monitors and equipment checked  Epidural Patient position: sitting Prep: Betadine Patient monitoring: heart rate, continuous pulse ox and blood pressure Approach: midline Location: L4-L5 Injection technique: LOR saline  Needle:  Needle type: Tuohy  Needle gauge: 18 G Needle length: 9 cm and 9 Catheter type: closed end flexible Catheter size: 20 Guage Test dose: negative and 1.5% lidocaine with Epi 1:200 K  Assessment Sensory level: T10 Events: blood not aspirated, injection not painful, no injection resistance, negative IV test and no paresthesia  Additional Notes   Patient tolerated the insertion well without complications.Reason for block:procedure for pain     

## 2017-01-28 NOTE — Anesthesia Preprocedure Evaluation (Signed)
Anesthesia Evaluation  Patient identified by MRN, date of birth, ID band Patient awake    Reviewed: Allergy & Precautions, NPO status , Patient's Chart, lab work & pertinent test results, reviewed documented beta blocker date and time   Airway Mallampati: III  TM Distance: >3 FB     Dental  (+) Chipped   Pulmonary           Cardiovascular      Neuro/Psych  Headaches, PSYCHIATRIC DISORDERS Anxiety Depression    GI/Hepatic   Endo/Other    Renal/GU      Musculoskeletal  (+) Arthritis ,   Abdominal   Peds  Hematology   Anesthesia Other Findings   Reproductive/Obstetrics                             Anesthesia Physical Anesthesia Plan  ASA: II  Anesthesia Plan: Epidural   Post-op Pain Management:    Induction:   PONV Risk Score and Plan:   Airway Management Planned:   Additional Equipment:   Intra-op Plan:   Post-operative Plan:   Informed Consent: I have reviewed the patients History and Physical, chart, labs and discussed the procedure including the risks, benefits and alternatives for the proposed anesthesia with the patient or authorized representative who has indicated his/her understanding and acceptance.     Plan Discussed with: CRNA  Anesthesia Plan Comments:         Anesthesia Quick Evaluation

## 2017-01-28 NOTE — Progress Notes (Signed)
  Labor Progress Note   41 y.o. G1P0 @ [redacted]w[redacted]d , admitted for  Pregnancy, Labor Management. IOL for PROM  Subjective:  Patient is comfortable with epidural turned down. She is beginning to feel some pressure.   Objective:  BP 120/75   Pulse 92   Temp 98.5 F (36.9 C) (Oral)   Resp 20   Ht 5\' 6"  (1.676 m)   Wt 235 lb (106.6 kg)   LMP 05/06/2016   BMI 37.93 kg/m  Abd: mild Extr: trace to 1+ bilateral pedal edema SVE: CERVIX: 10 cm dilated, 100 effaced, +1 station, able to push past anterior lip  EFM: FHR: 140 bpm, variability: moderate,  accelerations:  Present,  decelerations:  Present early decelerations Toco: Frequency: Every 3-4 minutes Labs: I have reviewed the patient's lab results.   Assessment & Plan:  G1P0 @ [redacted]w[redacted]d, admitted for  Pregnancy and Labor/Delivery Management  1. Pain management: epidural. 2. FWB: FHT category II. Overall reassuring  3. ID: GBS positive 4. Labor management: pitocin, beginning to push  All discussed with patient, see orders  Rod Can, [facility] 01/28/2017  8:27 PM

## 2017-01-28 NOTE — Progress Notes (Signed)
The patient is having increased discomfort in her back and hips. She is also discouraged and has concerns about her progress. She is requesting a consult with the doctor on call. Reassurance given of normal process and call to Dr Amalia Hailey was made for consult. He stated that he would come in to talk with the patient. She has been pushing for 2 and a half hours and is at plus 2 station.    Rod Can, CNM

## 2017-01-28 NOTE — Progress Notes (Signed)
  Labor Progress Note   41 y.o. G1P0 @ [redacted]w[redacted]d , admitted for  Pregnancy, Labor Management. PROM  Subjective:  Patient is still comfortable with epidural. She and her family have questions about the process of induction. They are concerned that they have passed the 24 hours since ROM. Discussion of plan of care and reassurance given.  Objective:  BP 114/70   Pulse 96   Temp 98.5 F (36.9 C) (Oral)   Resp 20   Ht 5\' 6"  (1.676 m)   Wt 235 lb (106.6 kg)   LMP 05/06/2016   BMI 37.93 kg/m  Abd: mild Extr: trace to 1+ bilateral pedal edema SVE: CERVIX: 7-8 cm dilated, 90 effaced, -1 station   EFM: FHR: 140 bpm, variability: moderate,  accelerations:  Present,  decelerations:  Absent Toco: Frequency: Every 2-4 minutes Labs: I have reviewed the patient's lab results.   Assessment & Plan:  G1P0 @ [redacted]w[redacted]d, admitted for  Pregnancy and Labor/Delivery Management  1. Pain management: epidural. 2. FWB: FHT category I.  3. ID: GBS positive 4. Labor management: continue titrating pitocin, cervical sweep  All discussed with patient, see orders  Rod Can, CNM 01/28/2017  3:50 PM

## 2017-01-28 NOTE — Progress Notes (Signed)
  Labor Progress Note   41 y.o. G1P0 @ [redacted]w[redacted]d , admitted for  Pregnancy, Labor Management.   Subjective:  Comfortable after epidural (just received)  Objective:  BP 116/71   Pulse 73   Temp 98 F (36.7 C) (Oral)   Resp 20   Ht 5\' 6"  (1.676 m)   Wt 235 lb (106.6 kg)   LMP 05/06/2016   BMI 37.93 kg/m  Abd: mild Extr: trace to 1+ bilateral pedal edema SVE: 3 cm prior to epidural, waiting to recheck  EFM: FHR: 140 bpm, variability: moderate,  accelerations:  Present,  decelerations:  Absent Toco: reg Labs: I have reviewed the patient's lab results.   Assessment & Plan:  G1P0 @ [redacted]w[redacted]d, admitted for  Pregnancy and Labor/Delivery Management  1. Pain management: epidural. 2. FWB: FHT category 1.  3. ID: GBS positive 4. Labor management: SROM for 18 hours now.  Cont Pitocin.  ABX.  Anticipate labor progression and delivery soon.  All discussed with patient, see orders  [name], MD, [facility], [facility] 01/28/2017  8:07 AM

## 2017-01-29 ENCOUNTER — Encounter: Payer: Self-pay | Admitting: Obstetrics and Gynecology

## 2017-01-29 DIAGNOSIS — D62 Acute posthemorrhagic anemia: Secondary | ICD-10-CM

## 2017-01-29 DIAGNOSIS — O4292 Full-term premature rupture of membranes, unspecified as to length of time between rupture and onset of labor: Secondary | ICD-10-CM

## 2017-01-29 DIAGNOSIS — Z3A38 38 weeks gestation of pregnancy: Secondary | ICD-10-CM

## 2017-01-29 LAB — CBC
HCT: 29.7 % — ABNORMAL LOW (ref 35.0–47.0)
Hemoglobin: 10.2 g/dL — ABNORMAL LOW (ref 12.0–16.0)
MCH: 31.5 pg (ref 26.0–34.0)
MCHC: 34.6 g/dL (ref 32.0–36.0)
MCV: 91.2 fL (ref 80.0–100.0)
PLATELETS: 211 10*3/uL (ref 150–440)
RBC: 3.25 MIL/uL — ABNORMAL LOW (ref 3.80–5.20)
RDW: 14 % (ref 11.5–14.5)
WBC: 16.1 10*3/uL — AB (ref 3.6–11.0)

## 2017-01-29 MED ORDER — OXYCODONE HCL 5 MG PO TABS: 5.0000 mg | ORAL_TABLET | ORAL | Status: DC | PRN

## 2017-01-29 MED ORDER — IBUPROFEN 600 MG PO TABS
600.0000 mg | ORAL_TABLET | Freq: Four times a day (QID) | ORAL | Status: DC
  Administered 2017-01-29 – 2017-01-31 (×10): 600 mg via ORAL
  Filled 2017-01-29 (×11): qty 1

## 2017-01-29 MED ORDER — ONDANSETRON HCL 4 MG PO TABS
4.0000 mg | ORAL_TABLET | ORAL | Status: DC | PRN
Start: 2017-01-29 — End: 2017-01-31

## 2017-01-29 MED ORDER — FERROUS SULFATE 325 (65 FE) MG PO TABS
325.0000 mg | ORAL_TABLET | Freq: Three times a day (TID) | ORAL | Status: DC
  Administered 2017-01-29 – 2017-01-31 (×6): 325 mg via ORAL
  Filled 2017-01-29 (×6): qty 1

## 2017-01-29 MED ORDER — OXYCODONE HCL 5 MG PO TABS: 10.0000 mg | ORAL_TABLET | ORAL | Status: DC | PRN

## 2017-01-29 MED ORDER — BENZOCAINE-MENTHOL 20-0.5 % EX AERO
1.0000 "application " | INHALATION_SPRAY | CUTANEOUS | Status: DC | PRN
  Filled 2017-01-29: qty 56

## 2017-01-29 MED ORDER — SERTRALINE HCL 100 MG PO TABS
100.0000 mg | ORAL_TABLET | Freq: Every day | ORAL | Status: DC
  Administered 2017-01-29 – 2017-01-31 (×3): 100 mg via ORAL
  Filled 2017-01-29 (×3): qty 1

## 2017-01-29 MED ORDER — WITCH HAZEL-GLYCERIN EX PADS: 1.0000 "application " | MEDICATED_PAD | CUTANEOUS | Status: DC | PRN

## 2017-01-29 MED ORDER — TETANUS-DIPHTH-ACELL PERTUSSIS 5-2.5-18.5 LF-MCG/0.5 IM SUSP: 0.5000 mL | Freq: Once | INTRAMUSCULAR | Status: DC

## 2017-01-29 MED ORDER — DIBUCAINE 1 % RE OINT: 1.0000 "application " | TOPICAL_OINTMENT | RECTAL | Status: DC | PRN

## 2017-01-29 MED ORDER — SENNOSIDES-DOCUSATE SODIUM 8.6-50 MG PO TABS
2.0000 | ORAL_TABLET | ORAL | Status: DC
  Administered 2017-01-29 – 2017-01-30 (×2): 2 via ORAL
  Filled 2017-01-29 (×2): qty 2

## 2017-01-29 MED ORDER — DIPHENHYDRAMINE HCL 25 MG PO CAPS: 25.0000 mg | ORAL_CAPSULE | Freq: Four times a day (QID) | ORAL | Status: DC | PRN

## 2017-01-29 MED ORDER — ONDANSETRON HCL 4 MG/2ML IJ SOLN: 4.0000 mg | INTRAMUSCULAR | Status: DC | PRN

## 2017-01-29 MED ORDER — SIMETHICONE 80 MG PO CHEW: 80.0000 mg | CHEWABLE_TABLET | ORAL | Status: DC | PRN

## 2017-01-29 MED ORDER — PRENATAL MULTIVITAMIN CH
1.0000 | ORAL_TABLET | Freq: Every day | ORAL | Status: DC
  Administered 2017-01-29 – 2017-01-31 (×3): 1 via ORAL
  Filled 2017-01-29 (×3): qty 1

## 2017-01-29 MED ORDER — ACETAMINOPHEN 325 MG PO TABS
650.0000 mg | ORAL_TABLET | ORAL | Status: DC | PRN
  Administered 2017-01-29 – 2017-01-31 (×5): 650 mg via ORAL
  Filled 2017-01-29 (×5): qty 2

## 2017-01-29 MED ORDER — MEASLES, MUMPS & RUBELLA VAC ~~LOC~~ INJ
0.5000 mL | INJECTION | Freq: Once | SUBCUTANEOUS | Status: AC
  Administered 2017-01-31: 0.5 mL via SUBCUTANEOUS
  Filled 2017-01-29 (×2): qty 0.5

## 2017-01-29 MED ORDER — COCONUT OIL OIL
1.0000 "application " | TOPICAL_OIL | Status: DC | PRN
  Administered 2017-01-29: 1 via TOPICAL
  Filled 2017-01-29: qty 120

## 2017-01-29 NOTE — Discharge Summary (Signed)
OB Discharge Summary     Patient Name: Amanda Stout DOB: 1975/12/01 MRN: [id number]  Date of admission: 01/27/2017 Delivering Provider: Rod Can, CNM, Dr Amalia Hailey in delivery room supervising use of Kiwi vacuum Date of Delivery: 01/29/2017  Date of discharge: 01/31/2017  Admitting diagnosis: 71 weeks Intrauterine pregnancy: [redacted]w[redacted]d     Secondary diagnosis: elderly primigravida, obesity, exposure to zika virus, major depressive disorder, anxiety, fetal arrhythmia before onset of labor, GBS positive, rubella non-immune, myalgia, migraine with aura     Discharge diagnosis: Term Pregnancy Delivered                                                                                                Post partum procedures:none  Augmentation: Pitocin  Complications: None  Hospital course:  Induction of Labor With Vaginal Delivery   41 y.o. yo G1P1001 at [redacted]w[redacted]d was admitted to the hospital 01/27/2017 for induction of labor.   Indication for induction: PROM.   Patient had an uncomplicated labor course as follows: Membrane Rupture Time/Date: 2:30 PM ,01/27/2017   Patient had delivery of viable female with vacuum assistance 12:58 AM, 01/29/2017 Details of delivery can be found in separate delivery note.   Patient had a routine postpartum course. Patient is discharged home 01/31/2017  Physical exam  Vitals:   01/30/17 1950 01/30/17 2329 01/31/17 0300 01/31/17 0732  BP: 116/68   (!) 109/52  Pulse: 86   86  Resp: 20   18  Temp: 98 F (36.7 C) 98.1 F (36.7 C) 97.9 F (36.6 C) 98.4 F (36.9 C)  TempSrc: Oral Oral Oral Oral  SpO2: 99%   99%  Weight:      Height:       General: alert, cooperative and no distress Lochia: appropriate Uterine Fundus: firm Incision: N/A DVT Evaluation: No evidence of DVT seen on physical exam.  Labs: Lab Results  Component Value Date   WBC 16.1 (H) 01/29/2017   HGB 10.2 (L) 01/29/2017   HCT 29.7 (L) 01/29/2017   MCV 91.2 01/29/2017   PLT 211 01/29/2017     Discharge instruction: per After Visit Summary.  Medications:  Allergies as of 01/31/2017      Reactions   Lactose Intolerance (gi) Other (See Comments)   GI UPSET      Medication List    STOP taking these medications   acetaminophen 500 MG tablet Commonly known as:  TYLENOL   Ferrous Fumarate 324 (106 Fe) MG Tabs tablet Commonly known as:  HEMOCYTE - 106 mg FE     TAKE these medications   AMEDA PURELY YOURS BREAST PUMP Misc   butalbital-acetaminophen-caffeine 50-325-40 MG tablet Commonly known as:  FIORICET, ESGIC Take 1 tablet by mouth every 6 (six) hours as needed for headache.   PRE-NATAL PO Take by mouth.   sertraline 100 MG tablet Commonly known as:  ZOLOFT TAKE 1 TABLET (100 MG TOTAL) BY MOUTH DAILY.       Diet: routine diet  Activity: Advance as tolerated. Pelvic rest for 6 weeks.   Outpatient follow up: Follow-up Information    Rod Can, CNM. Schedule  an appointment as soon as possible for a visit in 2 week(s).   Specialty:  Obstetrics Why:  postpartum mood check Contact information: [address] [place] [address] [phone]             Postpartum contraception: considering IUD Rhogam Given postpartum: NA Rubella vaccine given postpartum: Yes Varicella vaccine given postpartum: no TDaP given antepartum or postpartum: no  Newborn Data: Live born female Birth Weight: 7 lb 3.7 oz (3280 g) APGAR: 6, 8   Baby Feeding: Breast  Disposition:home with mother  SIGNED:  Rod Can, CNM 01/31/2017 9:30 AM

## 2017-01-29 NOTE — Progress Notes (Signed)
Post Partum Day 0/ VAVD Subjective: no complaints, voiding, tolerating PO and breast feeding baby Amanda Stout Post  Objective: Blood pressure 112/62, pulse 82, temperature 98.4 F (36.9 C), temperature source Oral, resp. rate 18, height '5\' 6"'  (1.676 m), weight 106.6 kg (235 lb), last menstrual period 05/06/2016, SpO2 98 %, unknown if currently breastfeeding.  Physical Exam:  General: alert, cooperative and no distress Lochia: appropriate/ scant Uterine Fundus: firm at U / ML/ NT  DVT Evaluation: No evidence of DVT seen on physical exam.   Recent Labs  01/27/17 1533 01/29/17 0505  HGB 11.8* 10.2*  HCT 33.9* 29.7*  WBC 10.7 16.1*  PLT 247 211    Assessment/Plan:  Stable day of delivery, 12 hours pp, mild anemia Plan for discharge tomorrow  Continue postpartum care  Iron and vitamins Breast O POS/ RNI/ VI-vaccinate with MMR prior to discharge TDAP given 12/16/2016 Contraception?  Dalia Heading, CNM    LOS: 2 days   Dalia Heading 01/29/2017, 11:17 AM

## 2017-01-30 DIAGNOSIS — D62 Acute posthemorrhagic anemia: Secondary | ICD-10-CM

## 2017-01-30 DIAGNOSIS — O4292 Full-term premature rupture of membranes, unspecified as to length of time between rupture and onset of labor: Secondary | ICD-10-CM

## 2017-01-30 DIAGNOSIS — Z3A38 38 weeks gestation of pregnancy: Secondary | ICD-10-CM

## 2017-01-30 NOTE — Anesthesia Postprocedure Evaluation (Signed)
Anesthesia Post Note  Patient: Amanda Stout  Procedure(s) Performed: * No procedures listed *  Patient location during evaluation: Mother Baby Anesthesia Type: Epidural Level of consciousness: awake and alert Pain management: pain level controlled Vital Signs Assessment: post-procedure vital signs reviewed and stable Respiratory status: spontaneous breathing, nonlabored ventilation and respiratory function stable Cardiovascular status: stable Postop Assessment: no headache, no backache and epidural receding Anesthetic complications: no     Last Vitals:  Vitals:   01/29/17 1908 01/29/17 2324  BP: 116/62 108/68  Pulse: 97 86  Resp: 20 20  Temp: 36.6 C 36.6 C    Last Pain:  Vitals:   01/30/17 0500  TempSrc:   PainSc: Asleep                 Alison Stalling

## 2017-01-30 NOTE — Progress Notes (Signed)
Patient ID: Amanda Stout, female   DOB: 11/01/1975, 41 y.o.   MRN: [id number]  Obstetric Postpartum Daily Progress Note Subjective:  41 y.o. G1P1001 postpartum day #1 status post vacuum assisted vaginal delivery.  She is ambulating, is tolerating po, is voiding spontaneously.  Her pain is well controlled on PO pain medications. Her lochia is less than menses.   Medications SCHEDULED MEDICATIONS  . ferrous sulfate  325 mg Oral TID WC  . ibuprofen  600 mg Oral Q6H  . measles, mumps and rubella vaccine  0.5 mL Subcutaneous Once  . prenatal multivitamin  1 tablet Oral Q1200  . senna-docusate  2 tablet Oral Q24H  . sertraline  100 mg Oral Daily    MEDICATION INFUSIONS    PRN MEDICATIONS  acetaminophen, benzocaine-Menthol, coconut oil, witch hazel-glycerin **AND** dibucaine, diphenhydrAMINE, ondansetron **OR** ondansetron (ZOFRAN) IV, oxyCODONE, oxyCODONE, simethicone    Objective:   Vitals:   01/29/17 1750 01/29/17 1908 01/29/17 2324 01/30/17 0829  BP: 119/74 116/62 108/68 113/66  Pulse: 93 97 86 85  Resp: '17 20 20 16  ' Temp: 98.4 F (36.9 C) 97.8 F (36.6 C) 97.9 F (36.6 C) 97.8 F (36.6 C)  TempSrc: Oral Oral Oral Oral  SpO2: 100% 100%  100%  Weight:      Height:        Current Vital Signs 24h Vital Sign Ranges  T 97.8 F (36.6 C) Temp  Avg: 98 F (36.7 C)  Min: 97.8 F (36.6 C)  Max: 98.4 F (36.9 C)  BP 113/66 BP  Min: 108/68  Max: 119/74  HR 85 Pulse  Avg: 91.6  Min: 85  Max: 97  RR 16 Resp  Avg: 18.2  Min: 16  Max: 20  SaO2 100 % Not Delivered SpO2  Avg: 100 %  Min: 100 %  Max: 100 %       24 Hour I/O Current Shift I/O  Time Ins Outs 07/29 0701 - 07/30 0700 In: 542.5 [P.O.:480; I.V.:62.5] Out: 600 [Urine:600] No intake/output data recorded.  General: NAD Pulmonary: no increased work of breathing Abdomen: non-distended, non-tender, fundus firm at level of umbilicus Extremities: no edema, no erythema, no tenderness  Labs:   Recent Labs Lab 01/27/17 1533  01/29/17 0505  WBC 10.7 16.1*  HGB 11.8* 10.2*  HCT 33.9* 29.7*  PLT 247 211     Assessment:   41 y.o. G1P1001 postpartum day # 1 status post VAVD, doing well  Plan:   1) Acute blood loss anemia - hemodynamically stable and asymptomatic - po ferrous sulfate  2) O POS / Rubella  non-immune (MMR prior to dc) / Varicella Immune  3) TDAP status up to date  4) breast feeding /Contraception = still deciding. I spent about 15 minutes in counseling her about birth control options.   5) h/o depression: continue zoloft.  2 week pp follow up  6) Disposition  Prentice Docker, MD 01/30/2017 11:06 AM

## 2017-01-31 NOTE — Progress Notes (Signed)
Discharge inst reviewed with pt.  Verb u/o.  Questions answered

## 2017-01-31 NOTE — Progress Notes (Signed)
Discharge to home to car via auxillary with NB

## 2017-02-03 ENCOUNTER — Encounter: Payer: Federal, State, Local not specified - PPO | Admitting: Obstetrics and Gynecology

## 2017-02-22 ENCOUNTER — Telehealth: Payer: Self-pay | Admitting: Obstetrics and Gynecology

## 2017-02-22 ENCOUNTER — Encounter: Payer: Self-pay | Admitting: Obstetrics & Gynecology

## 2017-02-22 ENCOUNTER — Ambulatory Visit (INDEPENDENT_AMBULATORY_CARE_PROVIDER_SITE_OTHER): Payer: Federal, State, Local not specified - PPO | Admitting: Obstetrics & Gynecology

## 2017-02-22 DIAGNOSIS — F53 Postpartum depression: Secondary | ICD-10-CM | POA: Insufficient documentation

## 2017-02-22 DIAGNOSIS — O99345 Other mental disorders complicating the puerperium: Secondary | ICD-10-CM | POA: Insufficient documentation

## 2017-02-22 DIAGNOSIS — R202 Paresthesia of skin: Secondary | ICD-10-CM | POA: Diagnosis not present

## 2017-02-22 DIAGNOSIS — R200 Anesthesia of skin: Secondary | ICD-10-CM | POA: Diagnosis not present

## 2017-02-22 NOTE — Telephone Encounter (Signed)
Pt coming for mirena insert with ams on 9/11 at 2:10

## 2017-02-22 NOTE — Progress Notes (Signed)
  History of Present Illness:  Amanda Stout is a 41 y.o. who was started on  Zoloft approximately 6 weeks ago. Since that time, she states that her symptoms are doing OK but still has moments of depression.  Was on Zoloft 50 mg all pregnancy and increased to 100 mg last month of pregnancy.  Also has had continued numbness in fingers, R>L.    VAVD 2 weeks ago. Bottle feeding.  Desires IUD for [facility].  PMHx: She  has a past medical history of ACL injury tear (04/2015); Arthritis; Chronic migraine; Exposure to [place] virus (09/29/2016); Lower back pain (2010); and Perennial allergic rhinitis. Also,  has a past surgical history that includes Wisdom tooth extraction (1998) and Knee arthroscopy with anterior cruciate ligament (acl) repair (Right, 04/10/2015)., family history includes Arrhythmia in her father; CAD (age of onset: 27) in her maternal grandfather and paternal grandfather; Cancer (age of onset: 68) in her mother; Hyperlipidemia in her father; Hypertension in her father, maternal grandfather, and paternal grandfather; Stroke in her maternal grandmother.,  reports that she has never smoked. She has never used smokeless tobacco. She reports that she drinks alcohol. She reports that she does not use drugs.  Current Outpatient Prescriptions:  .  butalbital-acetaminophen-caffeine (FIORICET, ESGIC) 50-325-40 MG tablet, Take 1 tablet by mouth every 6 (six) hours as needed for headache., Disp: 30 tablet, Rfl: 1 .  Misc. Devices (AMEDA PURELY YOURS BREAST PUMP) MISC, , Disp: , Rfl:  .  Prenatal Multivit-Min-Fe-FA (PRE-NATAL PO), Take by mouth., Disp: , Rfl:  .  sertraline (ZOLOFT) 100 MG tablet, TAKE 1 TABLET (100 MG TOTAL) BY MOUTH DAILY., Disp: 30 tablet, Rfl: 3 . Also, is allergic to lactose intolerance (gi)..  Review of Systems  All other systems reviewed and are negative.   Physical Exam:  BP 100/60   Pulse 93   Ht 5\' 6"  (1.676 m)   Wt 206 lb (93.4 kg)   BMI 33.25 kg/m  Body mass index is 33.25  kg/m. Constitutional: Well nourished, well developed female in no acute distress.  Abdomen: diffusely non tender to palpation, non distended, and no masses, hernias Neuro: Grossly intact Psych:  Normal mood and affect.    Assessment:  Problem List Items Addressed This Visit      Other   Postpartum care following vaginal delivery - Primary   Numbness and tingling in right hand   Depression, postpartum     Medication treatment is going well for her PPD/ long term depression..  Plan: She will undergo no change in her medical therapy.  Cont escalation of dose to 150 mg daily if sx's worsen.  Other med options discussed.  She is bottle feeding so no lactation risks. Edinburgh Scale 13  Plans IUD for [facility].  Discussed Migraines and options of BC as well as period control as her headaches are menstrual triggered at times.  Will plan Mirena at time of PP visit.  CTS and postpartum discussed.  Wrist guards and monitoring.  She was amenable to this plan.  Amanda Applebaum, MD, [facility], [facility] 02/22/2017  10:47 AM

## 2017-02-22 NOTE — Patient Instructions (Signed)

## 2017-03-08 NOTE — Telephone Encounter (Signed)
Noted. Will order to arrive by apt date/time. 

## 2017-03-14 ENCOUNTER — Encounter: Payer: Self-pay | Admitting: Obstetrics and Gynecology

## 2017-03-14 ENCOUNTER — Ambulatory Visit (INDEPENDENT_AMBULATORY_CARE_PROVIDER_SITE_OTHER): Payer: Federal, State, Local not specified - PPO | Admitting: Obstetrics and Gynecology

## 2017-03-14 DIAGNOSIS — F419 Anxiety disorder, unspecified: Secondary | ICD-10-CM

## 2017-03-14 DIAGNOSIS — Z30430 Encounter for insertion of intrauterine contraceptive device: Secondary | ICD-10-CM

## 2017-03-14 MED ORDER — SERTRALINE HCL 100 MG PO TABS: 150.0000 mg | ORAL_TABLET | Freq: Every day | ORAL | 3 refills | Status: DC

## 2017-03-14 NOTE — Progress Notes (Signed)
Postpartum Visit  Chief Complaint:  Chief Complaint  Patient presents with  . Postpartum Care  . Contraception    Mirena insert    History of Present Illness: Patient is a 41 y.o. G1P1001 presents for postpartum visit.   Review the Delivery Report for details.  Date of delivery:  This patient has no babies on file. Type of delivery: Vaginal delivery - Vacuum or forceps assisted  yes Episiotomy No.  Laceration: yes 1st degree Pregnancy or labor problems:  No Any problems since the delivery:  yes  Newborn Details:  SINGLETON :  Maternal Details:  Breast Feeding:  no Post partum depression/anxiety noted:  yes Edinburgh Post-Partum Depression Score:  11 item 10 is 0  Date of last PAP: 07/14/2016  normal HPV negative  Review of Systems: Review of Systems  Constitutional: Negative for chills and fever.  HENT: Negative for congestion.   Respiratory: Negative for cough and shortness of breath.   Cardiovascular: Negative for chest pain and palpitations.  Gastrointestinal: Negative for abdominal pain, constipation, diarrhea, heartburn, nausea and vomiting.  Genitourinary: Negative for dysuria, frequency and urgency.  Skin: Negative for itching and rash.  Neurological: Negative for dizziness and headaches.  Endo/Heme/Allergies: Negative for polydipsia.  Psychiatric/Behavioral: Negative for depression and suicidal ideas. The patient is nervous/anxious.     The following portions of the patient's history were reviewed and updated as appropriate: allergies, current medications, past family history, past medical history, past social history, past surgical history and problem list.  Past Medical History:  Past Medical History:  Diagnosis Date  . ACL injury tear 04/2015   right  . Arthritis    L4-5  . Chronic migraine   . Exposure to [place] virus 09/29/2016   Zika NAA urine serum done  . Lower back pain 2010   s/p herniated disc  . Perennial allergic rhinitis     Past  Surgical History:  Past Surgical History:  Procedure Laterality Date  . KNEE ARTHROSCOPY WITH ANTERIOR CRUCIATE LIGAMENT (ACL) REPAIR Right 04/10/2015   Marchia Bond, MD;  [place]  . WISDOM TOOTH EXTRACTION  1998    Family History:  Family History  Problem Relation Age of Onset  . Cancer Mother 24       uterine  . Hyperlipidemia Father   . Hypertension Father   . Arrhythmia Father   . Stroke Maternal Grandmother   . CAD Maternal Grandfather 38       MI  . Hypertension Maternal Grandfather   . CAD Paternal Grandfather 20       MI  . Hypertension Paternal Grandfather     Social History:  Social History   Social History  . Marital status: Single    Spouse name: N/A  . Number of children: N/A  . Years of education: N/A   Occupational History  . Not on file.   Social History Main Topics  . Smoking status: Never Smoker  . Smokeless tobacco: Never Used  . Alcohol use Yes     Comment: 2 x/week  . Drug use: No  . Sexual activity: Not Currently    Birth control/ protection: None   Other Topics Concern  . Not on file   Social History Narrative   Married 2016, has step son, 1 dog and 1 cat   Occupation: Passenger transport manager for [employer]   Edu: BS   Activity: walking dog, more intense exercises hurts back   Diet: good water, fruits/vegetables daily  Allergies:  Allergies  Allergen Reactions  . Lactose Intolerance (Gi) Other (See Comments)    GI UPSET    Medications: Prior to Admission medications   Medication Sig Start Date End Date Taking? Authorizing Provider  butalbital-acetaminophen-caffeine (FIORICET, ESGIC) [phone] MG tablet Take 1 tablet by mouth every 6 (six) hours as needed for headache. 09/26/16  Yes Will Bonnet, MD  Prenatal Multivit-Min-Fe-FA (PRE-NATAL PO) Take by mouth.   Yes [provider]  sertraline (ZOLOFT) 100 MG tablet TAKE 1 TABLET (100 MG TOTAL) BY MOUTH DAILY. 01/24/17  Yes Will Bonnet, MD     Physical Exam Vitals:  Vitals:   03/14/17 1414  BP: 106/70  Pulse: 82    General: NAD HEENT: normocephalic, anicteric Pulmonary: No increased work of breathing Abdomen: NABS, soft, non-tender, non-distended.  Umbilicus without lesions.  No hepatomegaly, splenomegaly or masses palpable. No evidence of hernia. Genitourinary:  External: Normal external female genitalia.  Normal urethral meatus, normal  Bartholin's and Skene's glands.    Vagina: Normal vaginal mucosa, no evidence of prolapse.    Cervix: Grossly normal in appearance, no bleeding  Uterus: Non-enlarged, mobile, normal contour.  No CMT  Adnexa: ovaries non-enlarged, no adnexal masses  Rectal: deferred Extremities: no edema, erythema, or tenderness Neurologic: Grossly intact Psychiatric: mood appropriate, affect full   GYNECOLOGY OFFICE PROCEDURE NOTE  Amanda Stout is a 41 y.o. G1P1001 here for Mirena IUD insertion. No GYN concerns.  Currently postpartum.  IUD Insertion Procedure Note Patient identified, informed consent performed, consent signed.   Discussed risks of irregular bleeding, cramping, infection, malpositioning, expulsion or uterine perforation of the IUD (1:1000 placements)  which may require further procedure such as laparoscopy.  IUD while effective at preventing pregnancy do not prevent transmission of sexually transmitted diseases and use of barrier methods for this purpose was discussed. Time out was performed.  Urine pregnancy test negative.  Speculum placed in the vagina.  Cervix visualized.  Cleaned with Betadine x 2.  Grasped anteriorly with a single tooth tenaculum.  Uterus sounded to 9 cm. IUD placed per manufacturer's recommendations.  Strings trimmed to 3 cm. Tenaculum was removed, good hemostasis noted.  Patient tolerated procedure well.   Patient was given post-procedure instructions.  She was advised to have backup contraception for one week.  Patient was also asked to check IUD strings  periodically and follow up in 6 weeks for IUD check.  Assessment: 41 y.o. G1P1001 presenting for 6 week postpartum visit  Plan: Problem List Items Addressed This Visit    None    Visit Diagnoses    Encounter for postpartum visit    -  Primary   Anxiety       Relevant Medications   sertraline (ZOLOFT) 100 MG tablet   Encounter for IUD insertion           1) Contraception Education given regarding options for contraception, IUD inserted today  2)  Pap - ASCCP guidelines and rational discussed.  Patient opts for q3year screening interval  3) Patient underwent screening for postpartum depression with mild concerns noted.  At present opts to increase zoloft from 100mg  to 150mg   4) Follow up 1 year for routine annual exam, 6 weeks IUD string check

## 2017-03-30 ENCOUNTER — Telehealth: Payer: Self-pay

## 2017-03-30 NOTE — Telephone Encounter (Signed)
Pt had IUD inserted 2wks ago, has been having a pinching sensation in uterus followed by slight to heavy bleeding for 2-4hrs at a time.  Is this an issue and typical?  [phone])

## 2017-03-30 NOTE — Telephone Encounter (Signed)
Left msg for pt to call back.  Needs appt to check placement.

## 2017-04-07 ENCOUNTER — Telehealth: Payer: Self-pay | Admitting: Obstetrics and Gynecology

## 2017-04-07 NOTE — Telephone Encounter (Signed)
Pt calling about her IUD and bleeding that she is having. She is having a pinching sensation and then notices some bleeding afterwards. Pt would like to speak with a nurse. [phone]

## 2017-04-13 ENCOUNTER — Encounter: Payer: Self-pay | Admitting: Obstetrics and Gynecology

## 2017-04-13 ENCOUNTER — Ambulatory Visit (INDEPENDENT_AMBULATORY_CARE_PROVIDER_SITE_OTHER): Payer: Federal, State, Local not specified - PPO | Admitting: Obstetrics and Gynecology

## 2017-04-13 VITALS — BP 110/76 | HR 83 | Ht 66.0 in | Wt 212.0 lb

## 2017-04-13 DIAGNOSIS — N939 Abnormal uterine and vaginal bleeding, unspecified: Secondary | ICD-10-CM

## 2017-04-13 DIAGNOSIS — Z30431 Encounter for routine checking of intrauterine contraceptive device: Secondary | ICD-10-CM

## 2017-04-13 NOTE — Progress Notes (Signed)
Obstetrics & Gynecology Office Visit   Chief Complaint:  Chief Complaint  Patient presents with  . Follow-up    IUD string check    History of Present Illness: 41 y.o. patient presenting for follow up of Mirena IUD placement 6+ weeks ago.  She admits to any complications since her IUD placement.  Still having some regular, continued bleeding and cramping.  is able to feel strings.    Review of Systems: Review of Systems  Constitutional: Negative for chills and fever.  Gastrointestinal: Positive for abdominal pain. Negative for nausea and vomiting.  Genitourinary: Negative for dysuria.    Past Medical History:  Past Medical History:  Diagnosis Date  . ACL injury tear 04/2015   right  . Arthritis    L4-5  . Chronic migraine   . Exposure to [place] virus 09/29/2016   Zika NAA urine serum done  . Lower back pain 2010   s/p herniated disc  . Perennial allergic rhinitis     Past Surgical History:  Past Surgical History:  Procedure Laterality Date  . KNEE ARTHROSCOPY WITH ANTERIOR CRUCIATE LIGAMENT (ACL) REPAIR Right 04/10/2015   Marchia Bond, MD;  [place]  . [place]    Gynecologic History: No LMP recorded. Patient is not currently having periods (Reason: IUD).  Obstetric History: G1P1001  Family History:  Family History  Problem Relation Age of Onset  . Cancer Mother 17       uterine  . Hyperlipidemia Father   . Hypertension Father   . Arrhythmia Father   . Stroke Maternal Grandmother   . CAD Maternal Grandfather 74       MI  . Hypertension Maternal Grandfather   . CAD Paternal Grandfather 45       MI  . Hypertension Paternal Grandfather     Social History:  Social History   Social History  . Marital status: Single    Spouse name: N/A  . Number of children: N/A  . Years of education: N/A   Occupational History  . Not on file.   Social History Main Topics  . Smoking status: Never Smoker  . Smokeless  tobacco: Never Used  . Alcohol use Yes     Comment: 2 x/week  . Drug use: No  . Sexual activity: Yes    Birth control/ protection: IUD   Other Topics Concern  . Not on file   Social History Narrative   Married 2016, has step son, 1 dog and 1 cat   Occupation: Passenger transport manager for [employer]   Edu: BS   Activity: walking dog, more intense exercises hurts back   Diet: good water, fruits/vegetables daily    Allergies:  Allergies  Allergen Reactions  . Lactose Intolerance (Gi) Other (See Comments)    GI UPSET    Medications: Prior to Admission medications   Medication Sig Start Date End Date Taking? Authorizing Provider  butalbital-acetaminophen-caffeine (FIORICET, ESGIC) [phone] MG tablet Take 1 tablet by mouth every 6 (six) hours as needed for headache. 09/26/16  Yes Will Bonnet, MD  Prenatal Multivit-Min-Fe-FA (PRE-NATAL PO) Take by mouth.   Yes [provider]  sertraline (ZOLOFT) 100 MG tablet Take 1.5 tablets (150 mg total) by mouth daily. 03/14/17  Yes Malachy Mood, MD    Physical Exam Vitals:  Vitals:   04/13/17 1357  BP: 110/76  Pulse: 83   No LMP recorded. Patient is not currently having periods (Reason: IUD).  General: NAD HEENT: normocephalic, anicteric Pulmonary: No increased work of breathing Genitourinary:  External: Normal external female genitalia.  Normal urethral meatus, normal  Bartholin's and Skene's glands.    Vagina: Normal vaginal mucosa, no evidence of prolapse.    Cervix: Grossly normal in appearance, no bleeding, IUD strings visualized 2cm  Uterus: Non-enlarged, mobile, normal contour.  No CMT  Adnexa: ovaries non-enlarged, no adnexal masses  Rectal: deferred  Lymphatic: no evidence of inguinal lymphadenopathy Extremities: no edema, erythema, or tenderness Neurologic: Grossly intact Psychiatric: mood appropriate, affect full  Female chaperone present for pelvic and breast  portions of the physical exam  Assessment: 41  y.o. G1P1001 AUB-I, IUD follow up  Plan: Problem List Items Addressed This Visit    None    Visit Diagnoses    IUD check up    -  Primary   Abnormal uterine bleeding (AUB)         Risks and benefits of IUD discussed including the risks of irregular bleeding, cramping, infection, malpositioning, expulsion or uterine perforation of the IUD (1:1000 placements)  which may require further procedures such as laparoscopy.  IUDs while effective at preventing pregnancy do not prevent transmission of sexually transmitted diseases and use of barrier methods for this purpose was discussed.  Low overall incidence of failure with 99.7% efficacy rate in typical use.  - given continued bleeding we talked about possibility of malposition - Will obtain TVUS to evaluate position, deployment, and EMS in the next 2 weeks if symptoms persist - We discussed alternative forms of contraception if ultrasound reveals normal findings but symptoms persist  A total of 15 minutes were spent in face-to-face contact with the patient during this encounter with over half of that time devoted to counseling and coordination of care.

## 2017-04-18 ENCOUNTER — Ambulatory Visit: Payer: Self-pay | Admitting: Family Medicine

## 2017-04-25 ENCOUNTER — Ambulatory Visit: Payer: Federal, State, Local not specified - PPO | Admitting: Obstetrics and Gynecology

## 2017-04-26 ENCOUNTER — Ambulatory Visit (INDEPENDENT_AMBULATORY_CARE_PROVIDER_SITE_OTHER): Payer: Federal, State, Local not specified - PPO | Admitting: Family Medicine

## 2017-04-26 ENCOUNTER — Encounter: Payer: Self-pay | Admitting: Family Medicine

## 2017-04-26 VITALS — BP 120/72 | HR 78 | Temp 98.1°F | Wt 211.0 lb

## 2017-04-26 DIAGNOSIS — Z23 Encounter for immunization: Secondary | ICD-10-CM | POA: Diagnosis not present

## 2017-04-26 DIAGNOSIS — F331 Major depressive disorder, recurrent, moderate: Secondary | ICD-10-CM

## 2017-04-26 DIAGNOSIS — F410 Panic disorder [episodic paroxysmal anxiety] without agoraphobia: Secondary | ICD-10-CM

## 2017-04-26 MED ORDER — LORAZEPAM 0.5 MG PO TABS: 0.2500 mg | ORAL_TABLET | Freq: Two times a day (BID) | ORAL | 0 refills | Status: DC | PRN

## 2017-04-26 MED ORDER — DULOXETINE HCL 30 MG PO CPEP: 30.0000 mg | ORAL_CAPSULE | Freq: Every day | ORAL | 3 refills | Status: DC

## 2017-04-26 NOTE — Assessment & Plan Note (Signed)
Ongoing, sertraline 150mg  not as effective as she would like. Requests return to cymbalta which she was on with good effect prior to pregnancy. She is not breast feeding. Will cross taper from sertraline to cymbalta 30mg  BID. RTC 6 wks f/u visit. Pt agrees with plan.  PHQ9 = 17

## 2017-04-26 NOTE — Assessment & Plan Note (Signed)
Worsening due to ongoing stressors, new baby, job, marital relationship. Reviewed with patient. She continues Patent examiner.  Will provide with temporary lorazepam course - discussed sedation precautions and risks of this controlled substance.  RTC 6 wks f/u visit.

## 2017-04-26 NOTE — Patient Instructions (Signed)
Congratulations on the new baby! Let's cross taper off sertraline and onto cymbalta (duloxetine). Take sertraline 50mg  daily for 1 week and start cymbalta 30mg  daily for 1 week then stop sertraline and increase cymbalta to 30mg  twice daily.  We will also use lorazepam 1/2-1 tablet as needed for anxiety attack - temporarily while cymbalta takes effect.  Return as needed or in 6 weeks for follow up.

## 2017-04-26 NOTE — Progress Notes (Signed)
BP 120/72 (BP Location: Left Arm, Patient Position: Sitting, Cuff Size: Large)   Pulse 78   Temp 98.1 F (36.7 C) (Oral)   Wt 211 lb (95.7 kg)   LMP 04/26/2017   SpO2 99%   BMI 34.06 kg/m    CC: anxiety  Subjective:    Patient ID: Amanda Stout, female    DOB: Jun 26, 1976, 41 y.o.   MRN: [id number]  HPI: Amanda Stout is a 41 y.o. female presenting on 04/26/2017 for Anxiety (discuss meds. Had baby about 3 mos ago and thinks may need to go back to previous med. Also, has had some anxiety attacks)   Prior h/o MDD treated with cymbalta 30mg  bid with lorazepam PRN.  Prior on prozac, currently on zoloft 100mg  daily.   New baby - delivered 3 months ago.  She continues prenatal vitamin.   High level of stress in marriage. Husband with ADHD. Stress getting back into job. New baby. She is very happy but sleep deprived.   She has been on zoloft preventatively through pregnancy and for the last 3 mo after birth.   Pt has counselor but she is currently in [place] caring for new grandchild.  Not breastfeeding.   Noticing increasing anxiety attacks seemingly around cycle described as increased anxiety/panicky feeling and dyspnea.   Relevant past medical, surgical, family and social history reviewed and updated as indicated. Interim medical history since our last visit reviewed. Allergies and medications reviewed and updated. Outpatient Medications Prior to Visit  Medication Sig Dispense Refill  . Prenatal Multivit-Min-Fe-FA (PRE-NATAL PO) Take by mouth.    . sertraline (ZOLOFT) 100 MG tablet Take 1.5 tablets (150 mg total) by mouth daily. 45 tablet 3  . butalbital-acetaminophen-caffeine (FIORICET, ESGIC) 50-325-40 MG tablet Take 1 tablet by mouth every 6 (six) hours as needed for headache. (Patient not taking: Reported on 04/26/2017) 30 tablet 1   No facility-administered medications prior to visit.      Per HPI unless specifically indicated in ROS section below Review of  Systems     Objective:    BP 120/72 (BP Location: Left Arm, Patient Position: Sitting, Cuff Size: Large)   Pulse 78   Temp 98.1 F (36.7 C) (Oral)   Wt 211 lb (95.7 kg)   LMP 04/26/2017   SpO2 99%   BMI 34.06 kg/m   Wt Readings from Last 3 Encounters:  04/26/17 211 lb (95.7 kg)  04/13/17 212 lb (96.2 kg)  02/22/17 206 lb (93.4 kg)    Physical Exam  Constitutional: She appears well-developed and well-nourished. No distress.  Psychiatric: She has a normal mood and affect. Her behavior is normal. Thought content normal.  Nursing note and vitals reviewed.      Assessment & Plan:  Over 25 minutes were spent face-to-face with the patient during this encounter and >50% of that time was spent on counseling and coordination of care  Problem List Items Addressed This Visit    Anxiety attack    Worsening due to ongoing stressors, new baby, job, marital relationship. Reviewed with patient. She continues Patent examiner.  Will provide with temporary lorazepam course - discussed sedation precautions and risks of this controlled substance.  RTC 6 wks f/u visit.       Relevant Medications   DULoxetine (CYMBALTA) 30 MG capsule   LORazepam (ATIVAN) 0.5 MG tablet   MDD (major depressive disorder), recurrent episode, moderate (HCC) - Primary    Ongoing, sertraline 150mg  not as effective as she would like. Requests  return to cymbalta which she was on with good effect prior to pregnancy. She is not breast feeding. Will cross taper from sertraline to cymbalta 30mg  BID. RTC 6 wks f/u visit. Pt agrees with plan.  PHQ9 = 17      Relevant Medications   DULoxetine (CYMBALTA) 30 MG capsule   LORazepam (ATIVAN) 0.5 MG tablet    Other Visit Diagnoses    Need for influenza vaccination       Relevant Orders   Flu Vaccine QUAD 6+ mos PF IM (Fluarix Quad PF) (Completed)       Follow up plan: Return in about 6 weeks (around 06/07/2017) for follow up visit.  Ria Bush, MD

## 2017-04-28 ENCOUNTER — Ambulatory Visit: Payer: Federal, State, Local not specified - PPO | Admitting: Obstetrics and Gynecology

## 2017-04-28 ENCOUNTER — Other Ambulatory Visit: Payer: Federal, State, Local not specified - PPO

## 2017-05-04 DIAGNOSIS — G43109 Migraine with aura, not intractable, without status migrainosus: Secondary | ICD-10-CM

## 2017-05-04 HISTORY — DX: Migraine with aura, not intractable, without status migrainosus: G43.109

## 2017-05-29 ENCOUNTER — Other Ambulatory Visit: Payer: Self-pay | Admitting: Obstetrics and Gynecology

## 2017-05-30 ENCOUNTER — Ambulatory Visit: Payer: Federal, State, Local not specified - PPO | Admitting: Obstetrics and Gynecology

## 2017-05-30 ENCOUNTER — Encounter: Payer: Self-pay | Admitting: Obstetrics and Gynecology

## 2017-05-30 VITALS — BP 120/80 | HR 75 | Ht 66.0 in | Wt 214.0 lb

## 2017-05-30 DIAGNOSIS — Z975 Presence of (intrauterine) contraceptive device: Secondary | ICD-10-CM | POA: Diagnosis not present

## 2017-05-30 DIAGNOSIS — Z30011 Encounter for initial prescription of contraceptive pills: Secondary | ICD-10-CM

## 2017-05-30 DIAGNOSIS — N921 Excessive and frequent menstruation with irregular cycle: Secondary | ICD-10-CM

## 2017-05-30 DIAGNOSIS — Z30432 Encounter for removal of intrauterine contraceptive device: Secondary | ICD-10-CM

## 2017-05-30 MED ORDER — NORETHINDRONE 0.35 MG PO TABS: 1.0000 | ORAL_TABLET | Freq: Every day | ORAL | 2 refills | Status: DC

## 2017-05-30 NOTE — Patient Instructions (Signed)
I value your feedback and entrusting us with your care. If you get a  patient survey, I would appreciate you taking the time to let us know about your experience today. Thank you! 

## 2017-05-30 NOTE — Progress Notes (Signed)
   Chief Complaint  Patient presents with  . Contraception     History of Present Illness:  Amanda Stout is a 41 y.o. that had a Mirena IUD placed approximately 3 months ago. Since that time, she has had constant bleeding and discomfort with pain/pressure. She can feel it all the time. She hasn't had an u/s to confirm placement but wants it removed.   She is interested in OCPs and did Yaz for many yrs without problems. She has a hx of migraines without aura before pregnancy but now gets them with aura.   Past Medical History:  Diagnosis Date  . ACL injury tear 04/2015   right  . Arthritis    L4-5  . Chronic migraine   . Exposure to [place] virus 09/29/2016   Zika NAA urine serum done  . Lower back pain 2010   s/p herniated disc  . Perennial allergic rhinitis    Past Surgical History:  Procedure Laterality Date  . KNEE ARTHROSCOPY WITH ANTERIOR CRUCIATE LIGAMENT (ACL) REPAIR Right 04/10/2015   Marchia Bond, MD;  [place]  . [place] EXTRACTION  1998   Review of Systems  Constitutional: Negative for fever.  Gastrointestinal: Negative for blood in stool, constipation, diarrhea, nausea and vomiting.  Genitourinary: Positive for menstrual problem and pelvic pain. Negative for dyspareunia, dysuria, flank pain, frequency, hematuria, urgency, vaginal bleeding, vaginal discharge and vaginal pain.  Musculoskeletal: Negative for back pain.  Skin: Negative for rash.  Neurological: Positive for headaches.   BP 120/80   Pulse 75   Ht 5\' 6"  (1.676 m)   Wt 214 lb (97.1 kg)   BMI 34.54 kg/m     Pelvic exam:  Two IUD strings present seen coming from the cervical os. EGBUS, vaginal vault and cervix: within normal limits  IUD Removal Strings of IUD identified and grasped.  IUD removed without problem with ring forceps.  Pt tolerated this well.  IUD noted to be intact.  Assessment:  Encounter for IUD removal - Given length of strings and sx, most likely not in  correct location. F/u prn.   Breakthrough bleeding with IUD  Encounter for initial prescription of contraceptive pills - POP start today. Rx camila. Condoms for 1 mo. Pt aware to use back up if more than 3hrs late with dose. Discussed prog only options d/t hx migraines with aura. - Plan: norethindrone (MICRONOR,CAMILA,ERRIN) 0.35 MG tablet  Meds ordered this encounter  Medications  . norethindrone (MICRONOR,CAMILA,ERRIN) 0.35 MG tablet    Sig: Take 1 tablet (0.35 mg total) by mouth daily.    Dispense:  84 tablet    Refill:  2    Plan: IUD removed and plan for contraception is oral progesterone-only contraceptive. She was amenable to this plan.  Walta [place] B. Amaani Guilbault, PA-C 05/30/2017 11:21 AM

## 2017-06-07 ENCOUNTER — Ambulatory Visit: Payer: Federal, State, Local not specified - PPO | Admitting: Family Medicine

## 2017-06-07 ENCOUNTER — Encounter: Payer: Self-pay | Admitting: Family Medicine

## 2017-06-07 VITALS — BP 118/68 | HR 79 | Temp 97.9°F | Wt 216.0 lb

## 2017-06-07 DIAGNOSIS — F410 Panic disorder [episodic paroxysmal anxiety] without agoraphobia: Secondary | ICD-10-CM | POA: Diagnosis not present

## 2017-06-07 DIAGNOSIS — G43109 Migraine with aura, not intractable, without status migrainosus: Secondary | ICD-10-CM | POA: Diagnosis not present

## 2017-06-07 DIAGNOSIS — F331 Major depressive disorder, recurrent, moderate: Secondary | ICD-10-CM

## 2017-06-07 MED ORDER — LORAZEPAM 0.5 MG PO TABS: 0.2500 mg | ORAL_TABLET | Freq: Two times a day (BID) | ORAL | 0 refills | Status: DC | PRN

## 2017-06-07 MED ORDER — BUTALBITAL-APAP-CAFFEINE 50-325-40 MG PO TABS: 1.0000 | ORAL_TABLET | Freq: Four times a day (QID) | ORAL | 1 refills | Status: DC | PRN

## 2017-06-07 MED ORDER — DULOXETINE HCL 20 MG PO CPEP: 20.0000 mg | ORAL_CAPSULE | Freq: Every day | ORAL | 6 refills | Status: DC

## 2017-06-07 MED ORDER — DULOXETINE HCL 20 MG PO CPEP: 20.0000 mg | ORAL_CAPSULE | Freq: Two times a day (BID) | ORAL | 6 refills | Status: DC

## 2017-06-07 NOTE — Assessment & Plan Note (Signed)
fioricet refilled for patient. This is more effective than imitrex to help stop migraine.

## 2017-06-07 NOTE — Progress Notes (Signed)
BP 118/68 (BP Location: Left Arm, Patient Position: Sitting, Cuff Size: Large)   Pulse 79   Temp 97.9 F (36.6 C) (Oral)   Wt 216 lb (98 kg)   LMP 05/31/2017   SpO2 98%   BMI 34.86 kg/m    CC: 6 wk f/u visit Subjective:    Patient ID: Amanda Stout, female    DOB: September 24, 1975, 41 y.o.   MRN: [id number]  HPI: LASSIE DEMOREST is a 41 y.o. female presenting on 06/07/2017 for 6 wk follow-up   Here with 4 mo son today.  See prior note for details.  Known MDD with anxiety attacks. Managed on sertraline 150mg  during recent pregnancy. Last visit we transitioned from sertraline 150mg  daily to cymbalta 30mg  bid+ lorazepam PRN anxiety. She continues Patent examiner.   Marked improvement with cymbalta - she has only been taking 3mg  once daily.  She feels she is doing well with lorazepam and it is effective in helping anxiety.   IUD recently removed.   Relevant past medical, surgical, family and social history reviewed and updated as indicated. Interim medical history since our last visit reviewed. Allergies and medications reviewed and updated. Outpatient Medications Prior to Visit  Medication Sig Dispense Refill  . norethindrone (MICRONOR,CAMILA,ERRIN) 0.35 MG tablet Take 1 tablet (0.35 mg total) by mouth daily. 84 tablet 2  . butalbital-acetaminophen-caffeine (FIORICET, ESGIC) 50-325-40 MG tablet Take 1 tablet by mouth every 6 (six) hours as needed for headache. 30 tablet 1  . DULoxetine (CYMBALTA) 30 MG capsule Take 1 capsule (30 mg total) by mouth daily. 60 capsule 3  . LORazepam (ATIVAN) 0.5 MG tablet Take 0.5-1 tablets (0.25-0.5 mg total) by mouth 2 (two) times daily as needed for anxiety. 30 tablet 0  . Prenatal Multivit-Min-Fe-FA (PRE-NATAL PO) Take by mouth.     No facility-administered medications prior to visit.      Per HPI unless specifically indicated in ROS section below Review of Systems     Objective:    BP 118/68 (BP Location: Left Arm, Patient Position: Sitting,  Cuff Size: Large)   Pulse 79   Temp 97.9 F (36.6 C) (Oral)   Wt 216 lb (98 kg)   LMP 05/31/2017   SpO2 98%   BMI 34.86 kg/m   Wt Readings from Last 3 Encounters:  06/07/17 216 lb (98 kg)  05/30/17 214 lb (97.1 kg)  04/26/17 211 lb (95.7 kg)    Physical Exam  Constitutional: She appears well-developed and well-nourished. No distress.  HENT:  Head: Normocephalic and atraumatic.  Mouth/Throat: Oropharynx is clear and moist. No oropharyngeal exudate.  Psychiatric: She has a normal mood and affect. Her behavior is normal. Judgment and thought content normal.  Nursing note and vitals reviewed.     Assessment & Plan:   Problem List Items Addressed This Visit    Anxiety attack    Has #12 pills remaining from prior Rx. Continue lorazepam PRN with instruction to continue to taper off med as depression is better controlled. Refilled today.       Relevant Medications   LORazepam (ATIVAN) 0.5 MG tablet   DULoxetine (CYMBALTA) 20 MG capsule   MDD (major depressive disorder), recurrent episode, moderate (HCC) - Primary    Marked improvement on cymbalta 30mg  daily. Notes some return of depressive symptoms later in the day. Desires higher dose. Will Rx cymbalta 20mg  2 in am or 1 BID. She will continue counseling. PHQ9 = 3 GAD7 = 2      Relevant  Medications   LORazepam (ATIVAN) 0.5 MG tablet   DULoxetine (CYMBALTA) 20 MG capsule   Migraine with typical aura    fioricet refilled for patient. This is more effective than imitrex to help stop migraine.       Relevant Medications   butalbital-acetaminophen-caffeine (FIORICET, ESGIC) 50-325-40 MG tablet   DULoxetine (CYMBALTA) 20 MG capsule       Follow up plan: Return in about 6 months (around 12/06/2017).  Ria Bush, MD

## 2017-06-07 NOTE — Assessment & Plan Note (Addendum)
Marked improvement on cymbalta 30mg  daily. Notes some return of depressive symptoms later in the day. Desires higher dose. Will Rx cymbalta 20mg  2 in am or 1 BID. She will continue counseling. PHQ9 = 3 GAD7 = 2

## 2017-06-07 NOTE — Patient Instructions (Addendum)
I'm glad you're doing well! Let's increase duloxetine (cymbalta) to 40mg  daily. Try both in the morning or one capsule twice daily.  Let me know if any questions or concerns.  Return as needed or in 6 months for follow up or physical.

## 2017-06-07 NOTE — Assessment & Plan Note (Addendum)
Has #12 pills remaining from prior Rx. Continue lorazepam PRN with instruction to continue to taper off med as depression is better controlled. Refilled today.

## 2017-06-16 ENCOUNTER — Encounter: Payer: Self-pay | Admitting: Family Medicine

## 2017-06-16 ENCOUNTER — Ambulatory Visit: Payer: Federal, State, Local not specified - PPO | Admitting: Family Medicine

## 2017-06-16 VITALS — BP 118/68 | HR 83 | Temp 98.0°F | Wt 217.0 lb

## 2017-06-16 DIAGNOSIS — F4322 Adjustment disorder with anxiety: Secondary | ICD-10-CM | POA: Diagnosis not present

## 2017-06-16 DIAGNOSIS — H66001 Acute suppurative otitis media without spontaneous rupture of ear drum, right ear: Secondary | ICD-10-CM

## 2017-06-16 DIAGNOSIS — H6691 Otitis media, unspecified, right ear: Secondary | ICD-10-CM | POA: Insufficient documentation

## 2017-06-16 MED ORDER — AMOXICILLIN 875 MG PO TABS: 875.0000 mg | ORAL_TABLET | Freq: Two times a day (BID) | ORAL | 0 refills | Status: DC

## 2017-06-16 NOTE — Patient Instructions (Signed)
You do have R ear infection. May use tylenol or ibuprofen for discomfort.  Start amoxicillin course sent to pharmacy for 7 days.  Let us know if not improving with treatment

## 2017-06-16 NOTE — Assessment & Plan Note (Signed)
R sided otitis media - treat with amox course. Supportive care reviewed. Update if not improving with treatment.

## 2017-06-16 NOTE — Progress Notes (Signed)
   BP 118/68 (BP Location: Left Arm, Patient Position: Sitting, Cuff Size: Large)   Pulse 83   Temp 98 F (36.7 C) (Oral)   Wt 217 lb (98.4 kg)   LMP 05/31/2017   SpO2 98%   BMI 35.02 kg/m    CC: ear pain Subjective:    Patient ID: Amanda Stout, female    DOB: Apr 26, 1976, 41 y.o.   MRN: [id number]  HPI: Amanda Stout is a 41 y.o. female presenting on 06/16/2017 for Ear Pain (right ear pain for more than 1 wk. Worsened since snow. Recently felt a "pop". Taking Claritin-D, helpful.)   1 wk h/o R ear pain and pressure. Felt pop 4 days ago with some relief of pressure. Some drainage from ear. Feels congested. No fevers/chills, cough.   No prior h/o ear infections.  Allergic rhinitis - ran out of claritin D.  She was around sick neighbor with ear infection  Relevant past medical, surgical, family and social history reviewed and updated as indicated. Interim medical history since our last visit reviewed. Allergies and medications reviewed and updated. Outpatient Medications Prior to Visit  Medication Sig Dispense Refill  . butalbital-acetaminophen-caffeine (FIORICET, ESGIC) 50-325-40 MG tablet Take 1 tablet by mouth every 6 (six) hours as needed for headache. 30 tablet 1  . DULoxetine (CYMBALTA) 20 MG capsule Take 1 capsule (20 mg total) by mouth 2 (two) times daily. 60 capsule 6  . LORazepam (ATIVAN) 0.5 MG tablet Take 0.5-1 tablets (0.25-0.5 mg total) by mouth 2 (two) times daily as needed for anxiety. 30 tablet 0  . norethindrone (MICRONOR,CAMILA,ERRIN) 0.35 MG tablet Take 1 tablet (0.35 mg total) by mouth daily. 84 tablet 2   No facility-administered medications prior to visit.      Per HPI unless specifically indicated in ROS section below Review of Systems     Objective:    BP 118/68 (BP Location: Left Arm, Patient Position: Sitting, Cuff Size: Large)   Pulse 83   Temp 98 F (36.7 C) (Oral)   Wt 217 lb (98.4 kg)   LMP 05/31/2017   SpO2 98%   BMI 35.02 kg/m   Wt  Readings from Last 3 Encounters:  06/16/17 217 lb (98.4 kg)  06/07/17 216 lb (98 kg)  05/30/17 214 lb (97.1 kg)    Physical Exam  Constitutional: She appears well-developed and well-nourished. No distress.  HENT:  Head: Normocephalic and atraumatic.  Right Ear: Hearing, external ear and ear canal normal.  Left Ear: Hearing, tympanic membrane, external ear and ear canal normal.  Nose: Nose normal. No mucosal edema or rhinorrhea.  Mouth/Throat: Uvula is midline, oropharynx is clear and moist and mucous membranes are normal. No oropharyngeal exudate, posterior oropharyngeal edema, posterior oropharyngeal erythema or tonsillar abscesses.  R ear - erythema with congestion upper tympanic membrane  Congested nasal mucosa  Eyes: Conjunctivae are normal. Pupils are equal, round, and reactive to light.  Lymphadenopathy:    She has no cervical adenopathy.  Nursing note and vitals reviewed.      Assessment & Plan:   Problem List Items Addressed This Visit    Right otitis media - Primary    R sided otitis media - treat with amox course. Supportive care reviewed. Update if not improving with treatment.       Relevant Medications   amoxicillin (AMOXIL) 875 MG tablet       Follow up plan: Return if symptoms worsen or fail to improve.  Ria Bush, MD

## 2017-07-03 ENCOUNTER — Encounter: Payer: Self-pay | Admitting: Emergency Medicine

## 2017-07-03 ENCOUNTER — Ambulatory Visit: Payer: Federal, State, Local not specified - PPO | Admitting: Emergency Medicine

## 2017-07-03 ENCOUNTER — Other Ambulatory Visit: Payer: Self-pay

## 2017-07-03 ENCOUNTER — Other Ambulatory Visit: Payer: Self-pay | Admitting: Obstetrics and Gynecology

## 2017-07-03 VITALS — BP 104/62 | HR 81 | Temp 98.5°F | Resp 16 | Ht 65.5 in | Wt 216.2 lb

## 2017-07-03 DIAGNOSIS — R6884 Jaw pain: Secondary | ICD-10-CM | POA: Insufficient documentation

## 2017-07-03 DIAGNOSIS — H6692 Otitis media, unspecified, left ear: Secondary | ICD-10-CM | POA: Diagnosis not present

## 2017-07-03 DIAGNOSIS — J069 Acute upper respiratory infection, unspecified: Secondary | ICD-10-CM

## 2017-07-03 MED ORDER — HYDROCODONE-ACETAMINOPHEN 5-325 MG PO TABS: 1.0000 | ORAL_TABLET | Freq: Four times a day (QID) | ORAL | 0 refills | Status: DC | PRN

## 2017-07-03 MED ORDER — SERTRALINE HCL 100 MG PO TABS: 100.0000 mg | ORAL_TABLET | Freq: Every day | ORAL | 3 refills | Status: DC

## 2017-07-03 MED ORDER — AMOXICILLIN-POT CLAVULANATE 875-125 MG PO TABS: 1.0000 | ORAL_TABLET | Freq: Two times a day (BID) | ORAL | 0 refills | Status: AC

## 2017-07-03 NOTE — Patient Instructions (Addendum)
IF you received an x-ray today, you will receive an invoice from [facility]. Please contact [facility] at [phone] with questions or concerns regarding your invoice.   IF you received labwork today, you will receive an invoice from [facility]. Please contact [facility] at [phone] with questions or concerns regarding your invoice.   Our billing staff will not be able to assist you with questions regarding bills from these companies.  You will be contacted with the lab results as soon as they are available. The fastest way to get your results is to activate your My Chart account. Instructions are located on the last page of this paperwork. If you have not heard from [place] regarding the results in 2 weeks, please contact this office.     Otitis Media, Adult Otitis media is redness, soreness, and puffiness (swelling) in the space just behind your eardrum (middle ear). It may be caused by allergies or infection. It often happens along with a cold. Follow these instructions at home:  Take your medicine as told. Finish it even if you start to feel better.  Only take over-the-counter or prescription medicines for pain, discomfort, or fever as told by your doctor.  Follow up with your doctor as told. Contact a doctor if:  You have otitis media only in one ear, or bleeding from your nose, or both.  You notice a lump on your neck.  You are not getting better in 3-5 days.  You feel worse instead of better. Get help right away if:  You have pain that is not helped with medicine.  You have puffiness, redness, or pain around your ear.  You get a stiff neck.  You cannot move part of your face (paralysis).  You notice that the bone behind your ear hurts when you touch it. This information is not intended to replace advice given to you by your health care provider. Make sure you discuss any questions you have with your health care provider. Document Released:  12/07/2007 Document Revised: 11/26/2015 Document Reviewed: 01/15/2013 Elsevier Interactive Patient Education  2017 [place].  Upper Respiratory Infection, Adult Most upper respiratory infections (URIs) are caused by a virus. A URI affects the nose, throat, and upper air passages. The most common type of URI is often called "the common cold." Follow these instructions at home:  Take medicines only as told by your doctor.  Gargle warm saltwater or take cough drops to comfort your throat as told by your doctor.  Use a warm mist humidifier or inhale steam from a shower to increase air moisture. This may make it easier to breathe.  Drink enough fluid to keep your pee (urine) clear or pale yellow.  Eat soups and other clear broths.  Have a healthy diet.  Rest as needed.  Go back to work when your fever is gone or your doctor says it is okay. ? You may need to stay home longer to avoid giving your URI to others. ? You can also wear a face mask and wash your hands often to prevent spread of the virus.  Use your inhaler more if you have asthma.  Do not use any tobacco products, including cigarettes, chewing tobacco, or electronic cigarettes. If you need help quitting, ask your doctor. Contact a doctor if:  You are getting worse, not better.  Your symptoms are not helped by medicine.  You have chills.  You are getting more short of breath.  You have brown or red mucus.  You have yellow or brown discharge from your nose.  You have pain in your face, especially when you bend forward.  You have a fever.  You have puffy (swollen) neck glands.  You have pain while swallowing.  You have white areas in the back of your throat. Get help right away if:  You have very bad or constant: ? Headache. ? Ear pain. ? Pain in your forehead, behind your eyes, and over your cheekbones (sinus pain). ? Chest pain.  You have long-lasting (chronic) lung disease and any of the  following: ? Wheezing. ? Long-lasting cough. ? Coughing up blood. ? A change in your usual mucus.  You have a stiff neck.  You have changes in your: ? Vision. ? Hearing. ? Thinking. ? Mood. This information is not intended to replace advice given to you by your health care provider. Make sure you discuss any questions you have with your health care provider. Document Released: 12/07/2007 Document Revised: 02/21/2016 Document Reviewed: 09/25/2013 Elsevier Interactive Patient Education  2018 [employer].

## 2017-07-03 NOTE — Progress Notes (Signed)
Amanda Stout 41 y.o.   Chief Complaint  Patient presents with  . Cough    x 4 days - PRODUCTIVE with yellow mucus and jaw pain  . Sore Throat    HISTORY OF PRESENT ILLNESS: This is a 41 y.o. female complaining of sore throat, productive cough, jaw/teeth pain x 4 days; family members also sick; 78 month old baby diagnosed with RSV. Also c/o pain to left ear.  HPI   Prior to Admission medications   Medication Sig Start Date End Date Taking? Authorizing Provider  butalbital-acetaminophen-caffeine (FIORICET, ESGIC) 50-325-40 MG tablet Take 1 tablet by mouth every 6 (six) hours as needed for headache. 06/07/17  Yes Ria Bush, MD  DULoxetine (CYMBALTA) 20 MG capsule Take 1 capsule (20 mg total) by mouth 2 (two) times daily. 06/07/17  Yes Ria Bush, MD  LORazepam (ATIVAN) 0.5 MG tablet Take 0.5-1 tablets (0.25-0.5 mg total) by mouth 2 (two) times daily as needed for anxiety. 06/07/17  Yes Ria Bush, MD  norethindrone (MICRONOR,CAMILA,ERRIN) 0.35 MG tablet Take 1 tablet (0.35 mg total) by mouth daily. 35/00/93  Yes Copland, Elmo Putt B, PA-C  sertraline (ZOLOFT) 100 MG tablet Take 1 tablet (100 mg total) by mouth daily. 07/03/17  Yes Malachy Mood, MD  amoxicillin (AMOXIL) 875 MG tablet Take 1 tablet (875 mg total) by mouth 2 (two) times daily. Patient not taking: Reported on 07/03/2017 06/16/17   Ria Bush, MD    Allergies  Allergen Reactions  . Lactose Intolerance (Gi) Other (See Comments)    GI UPSET    Patient Active Problem List   Diagnosis Date Noted  . Right otitis media 06/16/2017  . Numbness and tingling in right hand 02/22/2017  . Depression, postpartum 02/22/2017  . Postpartum care following vaginal delivery 01/29/2017  . Fetal arrhythmia before the onset of labor 01/02/2017  . Dizziness 11/01/2016  . Exposure to [place] virus 09/29/2016  . Supervision of high risk pregnancy, antepartum, third trimester 09/26/2016  . BMI 32.0-32.9,adult  09/26/2016  . Group beta Strep positive 07/11/2016  . Rubella non-immune status, antepartum 07/08/2016  . Anxiety attack 03/09/2016  . Myalgia 01/21/2016  . Complete tear of right ACL 04/10/2015  . Perennial allergic rhinitis   . Migraine with typical aura   . MDD (major depressive disorder), recurrent episode, moderate ([place])   . Lower back pain   . Lactose intolerance in adult     Past Medical History:  Diagnosis Date  . ACL injury tear 04/2015   right  . Arthritis    L4-5  . Chronic migraine   . Exposure to [place] virus 09/29/2016   Zika NAA urine serum done  . Lower back pain 2010   s/p herniated disc  . Migraine with aura 05/2017   since 2018 pregnancy  . Perennial allergic rhinitis     Past Surgical History:  Procedure Laterality Date  . KNEE ARTHROSCOPY WITH ANTERIOR CRUCIATE LIGAMENT (ACL) REPAIR Right 04/10/2015   Marchia Bond, MD;  [place]  . [place] EXTRACTION  1998    Social History   Socioeconomic History  . Marital status: Single    Spouse name: Not on file  . Number of children: Not on file  . Years of education: Not on file  . Highest education level: Not on file  Social Needs  . Financial resource strain: Not on file  . Food insecurity - worry: Not on file  . Food insecurity - inability: Not on file  . Transportation  needs - medical: Not on file  . Transportation needs - non-medical: Not on file  Occupational History  . Not on file  Tobacco Use  . Smoking status: Never Smoker  . Smokeless tobacco: Never Used  Substance and Sexual Activity  . Alcohol use: Yes    Comment: 2 x/week  . Drug use: No  . Sexual activity: Yes    Birth control/protection: IUD  Other Topics Concern  . Not on file  Social History Narrative   Married 2016, has step son, 1 dog and 1 cat   Occupation: Passenger transport manager for [employer]   Edu: BS   Activity: walking dog, more intense exercises hurts back   Diet: good water, fruits/vegetables daily     Family History  Problem Relation Age of Onset  . Cancer Mother 36       uterine  . Hyperlipidemia Father   . Hypertension Father   . Arrhythmia Father   . Stroke Maternal Grandmother   . CAD Maternal Grandfather 58       MI  . Hypertension Maternal Grandfather   . CAD Paternal Grandfather 1       MI  . Hypertension Paternal Grandfather      Review of Systems  Constitutional: Negative.  Negative for chills and fever.  HENT: Positive for congestion, ear pain, sinus pain and sore throat. Negative for nosebleeds.        Jaw/teeth pain  Eyes: Negative.   Respiratory: Positive for cough. Negative for shortness of breath.   Cardiovascular: Negative.  Negative for chest pain, palpitations and leg swelling.  Gastrointestinal: Negative for abdominal pain, diarrhea, nausea and vomiting.  Genitourinary: Negative for dysuria and hematuria.  Musculoskeletal: Negative for myalgias and neck pain.  Skin: Negative.  Negative for rash.  Neurological: Negative for dizziness and headaches.  Endo/Heme/Allergies: Negative.   All other systems reviewed and are negative.   Vitals:   07/03/17 1739  BP: 104/62  Pulse: 81  Resp: 16  Temp: 98.5 F (36.9 C)  SpO2: 98%    Physical Exam  Constitutional: She is oriented to person, place, and time. She appears well-developed and well-nourished.  HENT:  Head: Normocephalic and atraumatic.  Right Ear: Tympanic membrane normal.  Left Ear: Tympanic membrane is erythematous.  Mouth/Throat: Uvula is midline. Posterior oropharyngeal erythema present. No oropharyngeal exudate or tonsillar abscesses.  Eyes: Conjunctivae and EOM are normal. Pupils are equal, round, and reactive to light.  Neck: Normal range of motion. Neck supple. No JVD present.  Cardiovascular: Normal rate, regular rhythm and normal heart sounds.  Pulmonary/Chest: Effort normal and breath sounds normal.  Abdominal: Soft. Bowel sounds are normal. She exhibits no distension. There  is no tenderness.  Musculoskeletal: Normal range of motion.  Lymphadenopathy:    She has no cervical adenopathy.  Neurological: She is alert and oriented to person, place, and time. No sensory deficit. She exhibits normal muscle tone. Coordination normal.  Skin: Skin is warm and dry. Capillary refill takes less than 2 seconds. No rash noted.  Psychiatric: She has a normal mood and affect. Her behavior is normal.  Vitals reviewed.  A total of 30 minutes was spent in the room with the patient, greater than 50% of which was in counseling/coordination of care.   ASSESSMENT & PLAN: Amanda Stout was seen today for cough and sore throat.  Diagnoses and all orders for this visit:  Acute upper respiratory infection  Left otitis media, unspecified otitis media type -  amoxicillin-clavulanate (AUGMENTIN) 875-125 MG tablet; Take 1 tablet by mouth 2 (two) times daily for 7 days. -     HYDROcodone-acetaminophen (NORCO) 5-325 MG tablet; Take 1 tablet by mouth every 6 (six) hours as needed for moderate pain.  Jaw pain -     HYDROcodone-acetaminophen (NORCO) 5-325 MG tablet; Take 1 tablet by mouth every 6 (six) hours as needed for moderate pain.    Patient Instructions       IF you received an x-ray today, you will receive an invoice from [facility]. Please contact [facility] at [phone] with questions or concerns regarding your invoice.   IF you received labwork today, you will receive an invoice from [facility]. Please contact [facility] at [phone] with questions or concerns regarding your invoice.   Our billing staff will not be able to assist you with questions regarding bills from these companies.  You will be contacted with the lab results as soon as they are available. The fastest way to get your results is to activate your My Chart account. Instructions are located on the last page of this paperwork. If you have not heard from [place] regarding the results in 2 weeks,  please contact this office.     Otitis Media, Adult Otitis media is redness, soreness, and puffiness (swelling) in the space just behind your eardrum (middle ear). It may be caused by allergies or infection. It often happens along with a cold. Follow these instructions at home:  Take your medicine as told. Finish it even if you start to feel better.  Only take over-the-counter or prescription medicines for pain, discomfort, or fever as told by your doctor.  Follow up with your doctor as told. Contact a doctor if:  You have otitis media only in one ear, or bleeding from your nose, or both.  You notice a lump on your neck.  You are not getting better in 3-5 days.  You feel worse instead of better. Get help right away if:  You have pain that is not helped with medicine.  You have puffiness, redness, or pain around your ear.  You get a stiff neck.  You cannot move part of your face (paralysis).  You notice that the bone behind your ear hurts when you touch it. This information is not intended to replace advice given to you by your health care provider. Make sure you discuss any questions you have with your health care provider. Document Released: 12/07/2007 Document Revised: 11/26/2015 Document Reviewed: 01/15/2013 Elsevier Interactive Patient Education  2017 [place].  Upper Respiratory Infection, Adult Most upper respiratory infections (URIs) are caused by a virus. A URI affects the nose, throat, and upper air passages. The most common type of URI is often called "the common cold." Follow these instructions at home:  Take medicines only as told by your doctor.  Gargle warm saltwater or take cough drops to comfort your throat as told by your doctor.  Use a warm mist humidifier or inhale steam from a shower to increase air moisture. This may make it easier to breathe.  Drink enough fluid to keep your pee (urine) clear or pale yellow.  Eat soups and other clear  broths.  Have a healthy diet.  Rest as needed.  Go back to work when your fever is gone or your doctor says it is okay. ? You may need to stay home longer to avoid giving your URI to others. ? You can also wear a face mask and wash your  hands often to prevent spread of the virus.  Use your inhaler more if you have asthma.  Do not use any tobacco products, including cigarettes, chewing tobacco, or electronic cigarettes. If you need help quitting, ask your doctor. Contact a doctor if:  You are getting worse, not better.  Your symptoms are not helped by medicine.  You have chills.  You are getting more short of breath.  You have brown or red mucus.  You have yellow or brown discharge from your nose.  You have pain in your face, especially when you bend forward.  You have a fever.  You have puffy (swollen) neck glands.  You have pain while swallowing.  You have white areas in the back of your throat. Get help right away if:  You have very bad or constant: ? Headache. ? Ear pain. ? Pain in your forehead, behind your eyes, and over your cheekbones (sinus pain). ? Chest pain.  You have long-lasting (chronic) lung disease and any of the following: ? Wheezing. ? Long-lasting cough. ? Coughing up blood. ? A change in your usual mucus.  You have a stiff neck.  You have changes in your: ? Vision. ? Hearing. ? Thinking. ? Mood. This information is not intended to replace advice given to you by your health care provider. Make sure you discuss any questions you have with your health care provider. Document Released: 12/07/2007 Document Revised: 02/21/2016 Document Reviewed: 09/25/2013 Elsevier Interactive Patient Education  2018 Elsevier Inc.      Agustina Caroli, MD [facility]

## 2017-07-29 ENCOUNTER — Other Ambulatory Visit: Payer: Self-pay | Admitting: Obstetrics and Gynecology

## 2017-07-29 DIAGNOSIS — F419 Anxiety disorder, unspecified: Secondary | ICD-10-CM

## 2017-07-31 ENCOUNTER — Ambulatory Visit: Payer: Federal, State, Local not specified - PPO | Admitting: Family Medicine

## 2017-07-31 ENCOUNTER — Encounter: Payer: Self-pay | Admitting: Family Medicine

## 2017-07-31 VITALS — BP 120/74 | HR 75 | Temp 98.3°F | Wt 217.0 lb

## 2017-07-31 DIAGNOSIS — J019 Acute sinusitis, unspecified: Secondary | ICD-10-CM | POA: Diagnosis not present

## 2017-07-31 MED ORDER — DOXYCYCLINE HYCLATE 100 MG PO TABS: 100.0000 mg | ORAL_TABLET | Freq: Two times a day (BID) | ORAL | 0 refills | Status: DC

## 2017-07-31 NOTE — Patient Instructions (Addendum)
I think you have developing sinus infection - may still be viral however.  If viral, takes 7-10 days to resolve then head congestion improves. If ongoing symptoms past 10 days, or any worsening, fill antibotic provided today.  Push fluids and rest.  May take plain mucinex with plenty of water to help mobilize the mucous.  Start taking rhinocort or flonase for the next week. Use ibuprofen 400-600mg  with meals over next few days as well.   Sinusitis, Adult Sinusitis is soreness and inflammation of your sinuses. Sinuses are hollow spaces in the bones around your face. Your sinuses are located:  Around your eyes.  In the middle of your forehead.  Behind your nose.  In your cheekbones.  Your sinuses and nasal passages are lined with a stringy fluid (mucus). Mucus normally drains out of your sinuses. When your nasal tissues become inflamed or swollen, the mucus can become trapped or blocked so air cannot flow through your sinuses. This allows bacteria, viruses, and funguses to grow, which leads to infection. Sinusitis can develop quickly and last for 7?10 days (acute) or for more than 12 weeks (chronic). Sinusitis often develops after a cold. What are the causes? This condition is caused by anything that creates swelling in the sinuses or stops mucus from draining, including:  Allergies.  Asthma.  Bacterial or viral infection.  Abnormally shaped bones between the nasal passages.  Nasal growths that contain mucus (nasal polyps).  Narrow sinus openings.  Pollutants, such as chemicals or irritants in the air.  A foreign object stuck in the nose.  A fungal infection. This is rare.  What increases the risk? The following factors may make you more likely to develop this condition:  Having allergies or asthma.  Having had a recent cold or respiratory tract infection.  Having structural deformities or blockages in your nose or sinuses.  Having a weak immune system.  Doing a lot  of swimming or diving.  Overusing nasal sprays.  Smoking.  What are the signs or symptoms? The main symptoms of this condition are pain and a feeling of pressure around the affected sinuses. Other symptoms include:  Upper toothache.  Earache.  Headache.  Bad breath.  Decreased sense of smell and taste.  A cough that may get worse at night.  Fatigue.  Fever.  Thick drainage from your nose. The drainage is often green and it may contain pus (purulent).  Stuffy nose or congestion.  Postnasal drip. This is when extra mucus collects in the throat or back of the nose.  Swelling and warmth over the affected sinuses.  Sore throat.  Sensitivity to light.  How is this diagnosed? This condition is diagnosed based on symptoms, a medical history, and a physical exam. To find out if your condition is acute or chronic, your health care provider may:  Look in your nose for signs of nasal polyps.  Tap over the affected sinus to check for signs of infection.  View the inside of your sinuses using an imaging device that has a light attached (endoscope).  If your health care provider suspects that you have chronic sinusitis, you may also:  Be tested for allergies.  Have a sample of mucus taken from your nose (nasal culture) and checked for bacteria.  Have a mucus sample examined to see if your sinusitis is related to an allergy.  If your sinusitis does not respond to treatment and it lasts longer than 8 weeks, you may have an MRI or CT scan  to check your sinuses. These scans also help to determine how severe your infection is. In rare cases, a bone biopsy may be done to rule out more serious types of fungal sinus disease. How is this treated? Treatment for sinusitis depends on the cause and whether your condition is chronic or acute. If a virus is causing your sinusitis, your symptoms will go away on their own within 10 days. You may be given medicines to relieve your symptoms,  including:  Topical nasal decongestants. They shrink swollen nasal passages and let mucus drain from your sinuses.  Antihistamines. These drugs block inflammation that is triggered by allergies. This can help to ease swelling in your nose and sinuses.  Topical nasal corticosteroids. These are nasal sprays that ease inflammation and swelling in your nose and sinuses.  Nasal saline washes. These rinses can help to get rid of thick mucus in your nose.  If your condition is caused by bacteria, you will be given an antibiotic medicine. If your condition is caused by a fungus, you will be given an antifungal medicine. Surgery may be needed to correct underlying conditions, such as narrow nasal passages. Surgery may also be needed to remove polyps. Follow these instructions at home: Medicines  Take, use, or apply over-the-counter and prescription medicines only as told by your health care provider. These may include nasal sprays.  If you were prescribed an antibiotic medicine, take it as told by your health care provider. Do not stop taking the antibiotic even if you start to feel better. Hydrate and Humidify  Drink enough water to keep your urine clear or pale yellow. Staying hydrated will help to thin your mucus.  Use a cool mist humidifier to keep the humidity level in your home above 50%.  Inhale steam for 10-15 minutes, 3-4 times a day or as told by your health care provider. You can do this in the bathroom while a hot shower is running.  Limit your exposure to cool or dry air. Rest  Rest as much as possible.  Sleep with your head raised (elevated).  Make sure to get enough sleep each night. General instructions  Apply a warm, moist washcloth to your face 3-4 times a day or as told by your health care provider. This will help with discomfort.  Wash your hands often with soap and water to reduce your exposure to viruses and other germs. If soap and water are not available, use hand  sanitizer.  Do not smoke. Avoid being around people who are smoking (secondhand smoke).  Keep all follow-up visits as told by your health care provider. This is important. Contact a health care provider if:  You have a fever.  Your symptoms get worse.  Your symptoms do not improve within 10 days. Get help right away if:  You have a severe headache.  You have persistent vomiting.  You have pain or swelling around your face or eyes.  You have vision problems.  You develop confusion.  Your neck is stiff.  You have trouble breathing. This information is not intended to replace advice given to you by your health care provider. Make sure you discuss any questions you have with your health care provider. Document Released: 06/20/2005 Document Revised: 02/14/2016 Document Reviewed: 04/15/2015 Elsevier Interactive Patient Education  [employer].

## 2017-07-31 NOTE — Assessment & Plan Note (Addendum)
Anticipate viral given short duration - supportive care reviewed. Recommended ibuprofen, plain mucinex, nasal steroid, fluids and rest. WASP for doxy course (with sun precautions) provided with indications when to fill. Pt agrees with plan.

## 2017-07-31 NOTE — Progress Notes (Addendum)
BP 120/74 (BP Location: Left Arm, Patient Position: Sitting, Cuff Size: Large)   Pulse 75   Temp 98.3 F (36.8 C) (Oral)   Wt 217 lb (98.4 kg)   LMP 07/24/2017   SpO2 97%   BMI 35.56 kg/m    CC: cold symptoms Subjective:    Patient ID: Amanda Stout, female    DOB: 05-18-1976, 42 y.o.   MRN: [id number]  HPI: Amanda Stout is a 42 y.o. female presenting on 07/31/2017 for Cough (Somewhat productive. Started 07/25/17. Has lots to nasal drainage along with sneezing and chills. Tried OTC cold meds and vit C)   7d h/o cough (may be improving), sneezing, post nasal drainage and progressively worsening ST. Some dyspnea and head congestion and hoarseness. No earache, wheezing, fevers/chills, tooth pain or headache.  Seen twice in the past month with R acute otitis media (treated amox 875mg  7d course) then upper respiratory infection with L otitis media (augmentin 7d course, hydrocodone tablets). this fully improved. Child with recent RSV dx at home.   Treating with dayquil, nyquil, vit C, healthy diet.  Symptoms not really improving.  Non smoker No h/o asthma Allergies - treated with claritin D.   Relevant past medical, surgical, family and social history reviewed and updated as indicated. Interim medical history since our last visit reviewed. Allergies and medications reviewed and updated. Outpatient Medications Prior to Visit  Medication Sig Dispense Refill  . butalbital-acetaminophen-caffeine (FIORICET, ESGIC) 50-325-40 MG tablet Take 1 tablet by mouth every 6 (six) hours as needed for headache. 30 tablet 1  . DULoxetine (CYMBALTA) 20 MG capsule Take 1 capsule (20 mg total) by mouth 2 (two) times daily. 60 capsule 6  . HYDROcodone-acetaminophen (NORCO) 5-325 MG tablet Take 1 tablet by mouth every 6 (six) hours as needed for moderate pain. 15 tablet 0  . LORazepam (ATIVAN) 0.5 MG tablet Take 0.5-1 tablets (0.25-0.5 mg total) by mouth 2 (two) times daily as needed for anxiety. 30 tablet  0  . norethindrone (MICRONOR,CAMILA,ERRIN) 0.35 MG tablet Take 1 tablet (0.35 mg total) by mouth daily. 84 tablet 2  . sertraline (ZOLOFT) 100 MG tablet Take 1 tablet (100 mg total) by mouth daily. 90 tablet 3  . amoxicillin (AMOXIL) 875 MG tablet Take 1 tablet (875 mg total) by mouth 2 (two) times daily. (Patient not taking: Reported on 07/03/2017) 14 tablet 0   No facility-administered medications prior to visit.      Per HPI unless specifically indicated in ROS section below Review of Systems     Objective:    BP 120/74 (BP Location: Left Arm, Patient Position: Sitting, Cuff Size: Large)   Pulse 75   Temp 98.3 F (36.8 C) (Oral)   Wt 217 lb (98.4 kg)   LMP 07/24/2017   SpO2 97%   BMI 35.56 kg/m   Wt Readings from Last 3 Encounters:  07/31/17 217 lb (98.4 kg)  07/03/17 216 lb 3.2 oz (98.1 kg)  06/16/17 217 lb (98.4 kg)    Physical Exam  Constitutional: She appears well-developed and well-nourished. No distress.  Tired appearing  HENT:  Head: Normocephalic and atraumatic.  Right Ear: Hearing, tympanic membrane, external ear and ear canal normal.  Left Ear: Hearing, tympanic membrane, external ear and ear canal normal.  Nose: Mucosal edema (nasal mucosal congestion with crusting) and rhinorrhea present. Right sinus exhibits no maxillary sinus tenderness and no frontal sinus tenderness. Left sinus exhibits no maxillary sinus tenderness and no frontal sinus tenderness.  Mouth/Throat: Uvula  is midline, oropharynx is clear and moist and mucous membranes are normal. No oropharyngeal exudate, posterior oropharyngeal edema, posterior oropharyngeal erythema or tonsillar abscesses.  Eyes: Conjunctivae and EOM are normal. Pupils are equal, round, and reactive to light. No scleral icterus.  Neck: Normal range of motion. Neck supple.  Cardiovascular: Normal rate, regular rhythm, normal heart sounds and intact distal pulses.  No murmur heard. Pulmonary/Chest: Effort normal and breath  sounds normal. No respiratory distress. She has no wheezes. She has no rales.  Lymphadenopathy:    She has no cervical adenopathy.  Skin: Skin is warm and dry. No rash noted.  Nursing note and vitals reviewed.     Assessment & Plan:   Problem List Items Addressed This Visit    Acute sinusitis - Primary    Anticipate viral given short duration - supportive care reviewed. Recommended ibuprofen, plain mucinex, nasal steroid, fluids and rest. WASP for doxy course (with sun precautions) provided with indications when to fill. Pt agrees with plan.      Relevant Medications   doxycycline (VIBRA-TABS) 100 MG tablet       Follow up plan: Return if symptoms worsen or fail to improve.  Ria Bush, MD

## 2017-09-25 ENCOUNTER — Ambulatory Visit: Payer: Federal, State, Local not specified - PPO | Admitting: Family Medicine

## 2017-09-25 ENCOUNTER — Encounter: Payer: Self-pay | Admitting: Family Medicine

## 2017-09-25 VITALS — BP 108/68 | HR 84 | Temp 98.3°F | Wt 214.0 lb

## 2017-09-25 DIAGNOSIS — G43109 Migraine with aura, not intractable, without status migrainosus: Secondary | ICD-10-CM | POA: Diagnosis not present

## 2017-09-25 DIAGNOSIS — J019 Acute sinusitis, unspecified: Secondary | ICD-10-CM | POA: Diagnosis not present

## 2017-09-25 DIAGNOSIS — R509 Fever, unspecified: Secondary | ICD-10-CM

## 2017-09-25 LAB — POC INFLUENZA A&B (BINAX/QUICKVUE): INFLUENZA A, POC: NEGATIVE

## 2017-09-25 MED ORDER — SUMATRIPTAN SUCCINATE 50 MG PO TABS: 25.0000 mg | ORAL_TABLET | Freq: Once | ORAL | 0 refills | Status: DC

## 2017-09-25 MED ORDER — FLUTICASONE PROPIONATE 50 MCG/ACT NA SUSP: 2.0000 | Freq: Every day | NASAL | 6 refills | Status: DC

## 2017-09-25 MED ORDER — FLUTICASONE PROPIONATE 50 MCG/ACT NA SUSP: 2.0000 | Freq: Every day | NASAL | 1 refills | Status: DC

## 2017-09-25 NOTE — Progress Notes (Signed)
BP 108/68   Pulse 84   Temp 98.3 F (36.8 C) (Oral)   Wt 214 lb (97.1 kg)   LMP 09/11/2017   SpO2 97%   BMI 35.07 kg/m    CC: sinus congestion Subjective:    Patient ID: Amanda Stout, female    DOB: 06/03/1976, 42 y.o.   MRN: [id number]  HPI: Amanda Stout is a 42 y.o. female presenting on 09/25/2017 for Sinus Problem (started Wednesday, fever , head and chest congestion, sinus pain)   6d h/o sinus congestion with bad sinus headache triggering migraine. Productive cough, ST, drainage down throat, fever to 101. Body aches worse at night. No improvement. L earache, jaw pain.   No dyspnea or wheezing.   Treating with dayquil, nyquil, ibuprofen (400mg  twice daily). She is also taking claritin D. Flu exposure at work. Son with cough and fever last week.  No h/o asthma. She does have perennial allergic rhinitis Non smoker  Seen 3 times in the past 3 months with acute sinusitis (treated doxy), R acute otitis media (treated amox 875mg  7d course) then upper respiratory infection with L otitis media (augmentin 7d course, hydrocodone tablets). this fully improved. Child with recent RSV dx at home.   Migraine - treats with imitrex and fioricet  Relevant past medical, surgical, family and social history reviewed and updated as indicated. Interim medical history since our last visit reviewed. Allergies and medications reviewed and updated. Outpatient Medications Prior to Visit  Medication Sig Dispense Refill  . butalbital-acetaminophen-caffeine (FIORICET, ESGIC) 50-325-40 MG tablet Take 1 tablet by mouth every 6 (six) hours as needed for headache. 30 tablet 1  . DULoxetine (CYMBALTA) 20 MG capsule Take 1 capsule (20 mg total) by mouth 2 (two) times daily. 60 capsule 6  . HYDROcodone-acetaminophen (NORCO) 5-325 MG tablet Take 1 tablet by mouth every 6 (six) hours as needed for moderate pain. 15 tablet 0  . LORazepam (ATIVAN) 0.5 MG tablet Take 0.5-1 tablets (0.25-0.5 mg total) by mouth 2  (two) times daily as needed for anxiety. 30 tablet 0  . norethindrone (MICRONOR,CAMILA,ERRIN) 0.35 MG tablet Take 1 tablet (0.35 mg total) by mouth daily. 84 tablet 2  . doxycycline (VIBRA-TABS) 100 MG tablet Take 1 tablet (100 mg total) by mouth 2 (two) times daily. (Patient not taking: Reported on 09/25/2017) 20 tablet 0   No facility-administered medications prior to visit.      Per HPI unless specifically indicated in ROS section below Review of Systems     Objective:    BP 108/68   Pulse 84   Temp 98.3 F (36.8 C) (Oral)   Wt 214 lb (97.1 kg)   LMP 09/11/2017   SpO2 97%   BMI 35.07 kg/m   Wt Readings from Last 3 Encounters:  09/25/17 214 lb (97.1 kg)  07/31/17 217 lb (98.4 kg)  07/03/17 216 lb 3.2 oz (98.1 kg)    Physical Exam  Constitutional: She appears well-developed and well-nourished. No distress.  HENT:  Head: Normocephalic and atraumatic.  Right Ear: Hearing, tympanic membrane, external ear and ear canal normal.  Left Ear: Hearing, tympanic membrane, external ear and ear canal normal.  Nose: Mucosal edema present. No rhinorrhea. Right sinus exhibits maxillary sinus tenderness. Right sinus exhibits no frontal sinus tenderness. Left sinus exhibits maxillary sinus tenderness. Left sinus exhibits no frontal sinus tenderness.  Mouth/Throat: Uvula is midline, oropharynx is clear and moist and mucous membranes are normal. No oropharyngeal exudate or posterior oropharyngeal edema.  Eyes: Pupils  are equal, round, and reactive to light. Conjunctivae and EOM are normal. No scleral icterus.  Neck: Normal range of motion. Neck supple.  Cardiovascular: Normal rate, regular rhythm, normal heart sounds and intact distal pulses.  No murmur heard. Pulmonary/Chest: Effort normal and breath sounds normal. No respiratory distress. She has no wheezes. She has no rales.  Lungs clear  Lymphadenopathy:    She has no cervical adenopathy.  Skin: Skin is warm and dry. No rash noted.    Nursing note and vitals reviewed.  Results for orders placed or performed in visit on 09/25/17  POC Influenza A&B(BINAX/QUICKVUE)  Result Value Ref Range   Influenza A, POC Negative Negative   Influenza B, POC  Negative      Assessment & Plan:   Problem List Items Addressed This Visit    Acute sinusitis - Primary    Influenza like illness, possible acute viral sinusitis. Supportive care reviewed - rec NSAID, flonase, fluids and rest. Red flags to update [place] for further care reviewed. Pt agrees with plan.       Relevant Medications   fluticasone (FLONASE) 50 MCG/ACT nasal spray   Migraine with typical aura    Treats with fioricet and small dose of imitrex for breakthrough migraine. imitrex refilled today.       Relevant Medications   SUMAtriptan (IMITREX) 50 MG tablet    Other Visit Diagnoses    Fever, unspecified fever cause       Relevant Orders   POC Influenza A&B(BINAX/QUICKVUE) (Completed)       Meds ordered this encounter  Medications  . SUMAtriptan (IMITREX) 50 MG tablet    Sig: Take 0.5-1 tablets (25-50 mg total) by mouth once for 1 dose. May repeat in 2 hours if headache persists or recurs.    Dispense:  10 tablet    Refill:  0  . DISCONTD: fluticasone (FLONASE) 50 MCG/ACT nasal spray    Sig: Place 2 sprays into both nostrils daily.    Dispense:  16 g    Refill:  6  . fluticasone (FLONASE) 50 MCG/ACT nasal spray    Sig: Place 2 sprays into both nostrils daily.    Dispense:  16 g    Refill:  1    Use this # Refills   Orders Placed This Encounter  Procedures  . POC Influenza A&B(BINAX/QUICKVUE)    Follow up plan: No follow-ups on file.  Ria Bush, MD

## 2017-09-25 NOTE — Assessment & Plan Note (Signed)
Treats with fioricet and small dose of imitrex for breakthrough migraine. imitrex refilled today.

## 2017-09-25 NOTE — Patient Instructions (Signed)
You have a flu like illness or viral sinus infection. Push fluids and plenty of rest. Nasal saline irrigation or neti pot to help drain sinuses. Take ibuprofen 600mg  with meals for next 3-5 days as well as flonase nasal steroid for sinus inflammation.  May use plain mucinex with plenty of fluid to help mobilize mucous. Please let us know if fever >101.5, trouble opening/closing mouth, difficulty swallowing, or worsening instead of improving as expected.

## 2017-09-25 NOTE — Assessment & Plan Note (Signed)
Influenza like illness, possible acute viral sinusitis. Supportive care reviewed - rec NSAID, flonase, fluids and rest. Red flags to update [place] for further care reviewed. Pt agrees with plan.

## 2017-09-29 ENCOUNTER — Ambulatory Visit: Payer: Federal, State, Local not specified - PPO | Admitting: Family Medicine

## 2017-09-29 ENCOUNTER — Encounter: Payer: Self-pay | Admitting: Family Medicine

## 2017-09-29 VITALS — BP 122/70 | HR 88 | Temp 98.2°F | Wt 214.0 lb

## 2017-09-29 DIAGNOSIS — J209 Acute bronchitis, unspecified: Secondary | ICD-10-CM

## 2017-09-29 DIAGNOSIS — J019 Acute sinusitis, unspecified: Secondary | ICD-10-CM | POA: Diagnosis not present

## 2017-09-29 MED ORDER — AZITHROMYCIN 250 MG PO TABS: ORAL_TABLET | ORAL | 0 refills | Status: DC

## 2017-09-29 MED ORDER — GUAIFENESIN-CODEINE 100-10 MG/5ML PO SYRP: 5.0000 mL | ORAL_SOLUTION | Freq: Two times a day (BID) | ORAL | 0 refills | Status: DC | PRN

## 2017-09-29 NOTE — Assessment & Plan Note (Signed)
Persistent and worsening symptoms despite conservative treatment - anticipate developing bacterial infection. Will treat with azithromycin, cheratussin for cough. Update if not improving with treatment.

## 2017-09-29 NOTE — Progress Notes (Signed)
BP 122/70 (BP Location: Left Arm, Patient Position: Sitting, Cuff Size: Large)   Pulse 88   Temp 98.2 F (36.8 C) (Oral)   Wt 214 lb (97.1 kg)   LMP 09/11/2017   SpO2 98%   BMI 35.07 kg/m    CC: cough Subjective:    Patient ID: Amanda Stout, female    DOB: 06/15/1976, 42 y.o.   MRN: [id number]  HPI: SARAHGRACE Stout is a 42 y.o. female presenting on 09/29/2017 for Cough (Productive cough started 09/20/17. Was seen 09/25/17 but cough has worsened since that visit. )   Seen on Monday - see note for details. Thought flu like illness, treated with flonase or viral sinusitis, mucinex, ibuprofen. She is also taking claritin D, dayquil, nyquil.  Worsening cough since then - trouble sleeping at night, persistent congestion and drainage. Feels feverish. Child in daycare. He has ear infection as well.   Relevant past medical, surgical, family and social history reviewed and updated as indicated. Interim medical history since our last visit reviewed. Allergies and medications reviewed and updated. Outpatient Medications Prior to Visit  Medication Sig Dispense Refill  . butalbital-acetaminophen-caffeine (FIORICET, ESGIC) 50-325-40 MG tablet Take 1 tablet by mouth every 6 (six) hours as needed for headache. 30 tablet 1  . DULoxetine (CYMBALTA) 20 MG capsule Take 1 capsule (20 mg total) by mouth 2 (two) times daily. 60 capsule 6  . fluticasone (FLONASE) 50 MCG/ACT nasal spray Place 2 sprays into both nostrils daily. 16 g 1  . LORazepam (ATIVAN) 0.5 MG tablet Take 0.5-1 tablets (0.25-0.5 mg total) by mouth 2 (two) times daily as needed for anxiety. 30 tablet 0  . norethindrone (MICRONOR,CAMILA,ERRIN) 0.35 MG tablet Take 1 tablet (0.35 mg total) by mouth daily. 84 tablet 2  . SUMAtriptan (IMITREX) 50 MG tablet Take 0.5-1 tablets (25-50 mg total) by mouth once for 1 dose. May repeat in 2 hours if headache persists or recurs. 10 tablet 0  . HYDROcodone-acetaminophen (NORCO) 5-325 MG tablet Take 1 tablet  by mouth every 6 (six) hours as needed for moderate pain. 15 tablet 0   No facility-administered medications prior to visit.      Per HPI unless specifically indicated in ROS section below Review of Systems     Objective:    BP 122/70 (BP Location: Left Arm, Patient Position: Sitting, Cuff Size: Large)   Pulse 88   Temp 98.2 F (36.8 C) (Oral)   Wt 214 lb (97.1 kg)   LMP 09/11/2017   SpO2 98%   BMI 35.07 kg/m   Wt Readings from Last 3 Encounters:  09/29/17 214 lb (97.1 kg)  09/25/17 214 lb (97.1 kg)  07/31/17 217 lb (98.4 kg)    Physical Exam  Constitutional: She appears well-developed and well-nourished. No distress.  HENT:  Head: Normocephalic and atraumatic.  Right Ear: Hearing, tympanic membrane, external ear and ear canal normal.  Left Ear: Hearing, tympanic membrane, external ear and ear canal normal.  Nose: Mucosal edema (nasal mucosal congestion and mucosal crusting) present. No rhinorrhea. Right sinus exhibits maxillary sinus tenderness. Right sinus exhibits no frontal sinus tenderness. Left sinus exhibits maxillary sinus tenderness. Left sinus exhibits no frontal sinus tenderness.  Mouth/Throat: Uvula is midline, oropharynx is clear and moist and mucous membranes are normal. No oropharyngeal exudate, posterior oropharyngeal edema, posterior oropharyngeal erythema or tonsillar abscesses.  Eyes: Pupils are equal, round, and reactive to light. Conjunctivae and EOM are normal. No scleral icterus.  Neck: Normal range of motion. Neck  supple.  Cardiovascular: Normal rate, regular rhythm, normal heart sounds and intact distal pulses.  No murmur heard. Pulmonary/Chest: Effort normal and breath sounds normal. No respiratory distress. She has no wheezes. She has no rales.  Harsh cough present  Lymphadenopathy:    She has no cervical adenopathy.  Skin: Skin is warm and dry. No rash noted.  Nursing note and vitals reviewed.  Results for orders placed or performed in visit on  09/25/17  POC Influenza A&B(BINAX/QUICKVUE)  Result Value Ref Range   Influenza A, POC Negative Negative   Influenza B, POC  Negative      Assessment & Plan:   Problem List Items Addressed This Visit    Acute sinusitis - Primary    Persistent and worsening symptoms despite conservative treatment - anticipate developing bacterial infection. Will treat with azithromycin, cheratussin for cough. Update if not improving with treatment.       Relevant Medications   azithromycin (ZITHROMAX) 250 MG tablet   guaiFENesin-codeine (CHERATUSSIN AC) 100-10 MG/5ML syrup    Other Visit Diagnoses    Acute bronchitis, unspecified organism           Meds ordered this encounter  Medications  . azithromycin (ZITHROMAX) 250 MG tablet    Sig: Take two tablets on day one followed by one tablet on days 2-5    Dispense:  6 each    Refill:  0  . guaiFENesin-codeine (CHERATUSSIN AC) 100-10 MG/5ML syrup    Sig: Take 5 mLs by mouth 2 (two) times daily as needed for cough.    Dispense:  140 mL    Refill:  0   No orders of the defined types were placed in this encounter.   Follow up plan: Return if symptoms worsen or fail to improve.  Amanda Bush, MD

## 2017-09-29 NOTE — Patient Instructions (Signed)
I do think you have bronchitis and sinusitis - treat with zpack antibiotic, push fluids and rest, cheratussin codeine cough syrup with sedation precautions.

## 2017-11-02 ENCOUNTER — Ambulatory Visit: Payer: Federal, State, Local not specified - PPO | Admitting: Psychology

## 2017-11-02 DIAGNOSIS — F411 Generalized anxiety disorder: Secondary | ICD-10-CM

## 2017-11-08 DIAGNOSIS — L820 Inflamed seborrheic keratosis: Secondary | ICD-10-CM | POA: Diagnosis not present

## 2017-11-08 DIAGNOSIS — L918 Other hypertrophic disorders of the skin: Secondary | ICD-10-CM | POA: Diagnosis not present

## 2017-11-08 DIAGNOSIS — D229 Melanocytic nevi, unspecified: Secondary | ICD-10-CM | POA: Diagnosis not present

## 2017-11-09 ENCOUNTER — Ambulatory Visit: Payer: Federal, State, Local not specified - PPO | Admitting: Psychology

## 2017-11-09 DIAGNOSIS — F411 Generalized anxiety disorder: Secondary | ICD-10-CM | POA: Diagnosis not present

## 2017-11-16 ENCOUNTER — Ambulatory Visit: Payer: Federal, State, Local not specified - PPO | Admitting: Psychology

## 2017-11-16 DIAGNOSIS — F411 Generalized anxiety disorder: Secondary | ICD-10-CM | POA: Diagnosis not present

## 2017-11-24 ENCOUNTER — Ambulatory Visit: Payer: Federal, State, Local not specified - PPO | Admitting: Psychology

## 2017-11-24 DIAGNOSIS — F411 Generalized anxiety disorder: Secondary | ICD-10-CM | POA: Diagnosis not present

## 2017-11-28 ENCOUNTER — Other Ambulatory Visit: Payer: Self-pay | Admitting: Family Medicine

## 2017-11-28 NOTE — Telephone Encounter (Signed)
Eprescribed.

## 2017-11-28 NOTE — Telephone Encounter (Signed)
Electronic refill request Last refill 06/07/17 #30/1 Last office visit 09/29/17

## 2017-11-30 ENCOUNTER — Ambulatory Visit: Payer: Federal, State, Local not specified - PPO | Admitting: Psychology

## 2017-11-30 DIAGNOSIS — F411 Generalized anxiety disorder: Secondary | ICD-10-CM

## 2017-12-07 DIAGNOSIS — L700 Acne vulgaris: Secondary | ICD-10-CM | POA: Diagnosis not present

## 2017-12-07 DIAGNOSIS — L738 Other specified follicular disorders: Secondary | ICD-10-CM | POA: Diagnosis not present

## 2017-12-08 ENCOUNTER — Ambulatory Visit: Payer: Self-pay | Admitting: Psychology

## 2017-12-14 ENCOUNTER — Ambulatory Visit: Payer: Federal, State, Local not specified - PPO | Admitting: Psychology

## 2017-12-14 DIAGNOSIS — F411 Generalized anxiety disorder: Secondary | ICD-10-CM

## 2017-12-21 ENCOUNTER — Ambulatory Visit: Payer: Federal, State, Local not specified - PPO | Admitting: Psychology

## 2017-12-21 DIAGNOSIS — F411 Generalized anxiety disorder: Secondary | ICD-10-CM | POA: Diagnosis not present

## 2017-12-21 DIAGNOSIS — L700 Acne vulgaris: Secondary | ICD-10-CM | POA: Diagnosis not present

## 2017-12-21 DIAGNOSIS — L738 Other specified follicular disorders: Secondary | ICD-10-CM | POA: Diagnosis not present

## 2017-12-21 DIAGNOSIS — L299 Pruritus, unspecified: Secondary | ICD-10-CM | POA: Diagnosis not present

## 2017-12-28 ENCOUNTER — Ambulatory Visit: Payer: Federal, State, Local not specified - PPO | Admitting: Psychology

## 2017-12-28 DIAGNOSIS — F411 Generalized anxiety disorder: Secondary | ICD-10-CM

## 2018-01-17 ENCOUNTER — Ambulatory Visit: Payer: Federal, State, Local not specified - PPO | Admitting: Family Medicine

## 2018-01-17 DIAGNOSIS — Z0289 Encounter for other administrative examinations: Secondary | ICD-10-CM

## 2018-01-19 ENCOUNTER — Ambulatory Visit: Payer: Self-pay | Admitting: Psychology

## 2018-01-25 ENCOUNTER — Ambulatory Visit: Payer: Federal, State, Local not specified - PPO | Admitting: Psychology

## 2018-01-31 ENCOUNTER — Other Ambulatory Visit: Payer: Self-pay | Admitting: Family Medicine

## 2018-01-31 ENCOUNTER — Other Ambulatory Visit: Payer: Self-pay | Admitting: Obstetrics and Gynecology

## 2018-01-31 ENCOUNTER — Ambulatory Visit: Payer: Self-pay | Admitting: Family Medicine

## 2018-01-31 DIAGNOSIS — Z30011 Encounter for initial prescription of contraceptive pills: Secondary | ICD-10-CM

## 2018-01-31 NOTE — Telephone Encounter (Signed)
Imitrex Last filled:  09/05/17, #10 Last OV:  09/29/17, acute Next OV:  None with Dr. Darnell Level;  02/01/18 with Dr. Lorelei Pont

## 2018-01-31 NOTE — Telephone Encounter (Signed)
Called in stating she was bitten by her neighbor's dog 12 days ago (July 20th) on her right Achilles heel area.   The wound is scabbed over and almost healed but she is having cramping in the area that is extending up into her knee today.   Both dogs up to date on their vaccinations including rabies.   I used my right leg to separate my dog from my neighbor's dog when they got into a fight.    See triage notes.   The protocol is to be seen within 4 hours however there is no red areas, red streaks or pus or pain per pt.   Just the cramping.   She c/o having "flu-like symptoms for the past week".    Some nausea with muscle and joint aches.   No vomiting.   "I have a one year old so I've been ignoring my symptoms thinking I had a sinus infection or something".    She has a puncture wound on each side of the Achilles  Area on the right leg with a "deep scratch mark".  I scheduled her with Amanda Stout for 02/01/18 at 11:00.     The protocol is to be seen within 4 hours however the wound is 33 days old without redness, swelling, or discharge.   "The wound is scabbed over" per  Pt.   Could not find a protocol that fit this situation specifically.    Reason for Disposition . Looks infected (red area, red streak, OR pus)    No redness or swelling.   The wound is almost healed.   She is having cramping in the right leg from the heel up into her knee.  Answer Assessment - Initial Assessment Questions 1. ANIMAL: "What type of animal caused the bite?" "Is the injury from a bite or a claw?" If the animal is a dog or a cat, ask: "Was it a pet or a stray?" "Was it acting ill or behaving strangely?"     I was dog sitting. l  The dog is up to date on shots.   My dog and her dog go into a fight.    I used my leg to separate the dogs.   I have a 40 year old so I've been ignoring my symptoms.     Both dogs are up to date on their vaccinations including rabies.     I've had a fever off and on for past week.    Flu  like symptoms.   Chills then hot.  Nausea but no vomiting.   I'm having muscle and joint aches.   I'm having muscle spasms in foot where I was bitten.   I'm cramping up into my calf and now it's traveled into my knee.   I'm ambulating ok.    2. LOCATION: "Where is the bite located?"      Right Achilles heel area.   It's almost healed.   July 20th bite occurred.   3. SIZE: "How big is the bite?" "What does it look like?"      It was very small.   Small puncture.  A deep scratch on one side and another puncture wound.   4. ONSET: "When did the bite happen?" (Minutes or hours ago)      July 20th. 5. CIRCUMSTANCES: "Tell me how this happened."      See above. 6. TETANUS: "When was the last tetanus booster?"     I think Tetanus was  updated in 2018.     7. PREGNANCY: "Is there any chance you are pregnant?" "When was your last menstrual period?"     No  Protocols used: ANIMAL BITE-A-AH

## 2018-02-01 ENCOUNTER — Ambulatory Visit: Payer: Federal, State, Local not specified - PPO | Admitting: Family Medicine

## 2018-02-01 ENCOUNTER — Encounter: Payer: Self-pay | Admitting: Family Medicine

## 2018-02-01 VITALS — BP 90/64 | HR 78 | Temp 98.8°F | Ht 65.5 in | Wt 223.2 lb

## 2018-02-01 DIAGNOSIS — M25571 Pain in right ankle and joints of right foot: Secondary | ICD-10-CM

## 2018-02-01 DIAGNOSIS — W540XXA Bitten by dog, initial encounter: Secondary | ICD-10-CM

## 2018-02-01 MED ORDER — BUTALBITAL-APAP-CAFFEINE 50-325-40 MG PO TABS: ORAL_TABLET | ORAL | 0 refills | Status: DC

## 2018-02-01 MED ORDER — DULOXETINE HCL 20 MG PO CPEP: 20.0000 mg | ORAL_CAPSULE | Freq: Two times a day (BID) | ORAL | 6 refills | Status: DC

## 2018-02-01 NOTE — Progress Notes (Signed)
Dr. Frederico Hamman T. Latanga Nedrow, MD, [facility] [place], [address] Phone: [phone] Fax: [phone]  02/01/2018  Patient: Amanda Stout, MRN: [id number], DOB: 09-22-1975, 42 y.o.  Primary Physician:  Ria Bush, MD   Chief Complaint  Patient presents with  . Animal Bite    12 days ago-Right Heel  . Foot Cramps  . Fever   Subjective:   Amanda Stout is a 42 y.o. very pleasant female patient who presents with the following:  Fever, foot cramps. Got bitten by a dog almost 2. Neighbor's dog - rabies shots.  This is her neighbor's dog, and she is confirmed that all of his shots are up-to-date.  She did have some light fever that was all less than 100.  She is also had some foot cramps, but the wound is completely healed at this point there is no redness, warmth, she has good range of motion throughout the ankle and lower extremity.  Her tetanus shot is up-to-date.    Past Medical History, Surgical History, Social History, Family History, Problem List, Medications, and Allergies have been reviewed and updated if relevant.  Patient Active Problem List   Diagnosis Date Noted  . Left otitis media 07/03/2017  . Jaw pain 07/03/2017  . Right otitis media 06/16/2017  . Numbness and tingling in right hand 02/22/2017  . Depression, postpartum 02/22/2017  . Postpartum care following vaginal delivery 01/29/2017  . Fetal arrhythmia before the onset of labor 01/02/2017  . Dizziness 11/01/2016  . Exposure to [place] virus 09/29/2016  . Supervision of high risk pregnancy, antepartum, third trimester 09/26/2016  . BMI 32.0-32.9,adult 09/26/2016  . Group beta Strep positive 07/11/2016  . Rubella non-immune status, antepartum 07/08/2016  . Anxiety attack 03/09/2016  . Myalgia 01/21/2016  . Acute sinusitis 05/01/2015  . Complete tear of right ACL 04/10/2015  . Perennial allergic rhinitis   . Migraine with typical aura   . MDD  (major depressive disorder), recurrent episode, moderate ([place])   . Lower back pain   . Lactose intolerance in adult     Past Medical History:  Diagnosis Date  . ACL injury tear 04/2015   right  . Arthritis    L4-5  . Chronic migraine   . Exposure to [place] virus 09/29/2016   Zika NAA urine serum done  . Lower back pain 2010   s/p herniated disc  . Migraine with aura 05/2017   since 2018 pregnancy  . Perennial allergic rhinitis     Past Surgical History:  Procedure Laterality Date  . KNEE ARTHROSCOPY WITH ANTERIOR CRUCIATE LIGAMENT (ACL) REPAIR Right 04/10/2015   Marchia Bond, MD;  [place]  . [place] EXTRACTION  1998    Social History   Socioeconomic History  . Marital status: Single    Spouse name: Not on file  . Number of children: Not on file  . Years of education: Not on file  . Highest education level: Not on file  Occupational History  . Not on file  Social Needs  . Financial resource strain: Not on file  . Food insecurity:    Worry: Not on file    Inability: Not on file  . Transportation needs:    Medical: Not on file    Non-medical: Not on file  Tobacco Use  . Smoking status: Never Smoker  . Smokeless tobacco: Never Used  Substance and Sexual Activity  . Alcohol use: Yes  Comment: 2 x/week  . Drug use: No  . Sexual activity: Yes    Birth control/protection: IUD  Lifestyle  . Physical activity:    Days per week: Not on file    Minutes per session: Not on file  . Stress: Not on file  Relationships  . Social connections:    Talks on phone: Not on file    Gets together: Not on file    Attends religious service: Not on file    Active member of club or organization: Not on file    Attends meetings of clubs or organizations: Not on file    Relationship status: Not on file  . Intimate partner violence:    Fear of current or ex partner: Not on file    Emotionally abused: Not on file    Physically abused: Not on file    Forced  sexual activity: Not on file  Other Topics Concern  . Not on file  Social History Narrative   Married 2016, has step son, 1 dog and 1 cat   Occupation: Passenger transport manager for [employer]   Edu: BS   Activity: walking dog, more intense exercises hurts back   Diet: good water, fruits/vegetables daily    Family History  Problem Relation Age of Onset  . Cancer Mother 35       uterine  . Hyperlipidemia Father   . Hypertension Father   . Arrhythmia Father   . Stroke Maternal Grandmother   . CAD Maternal Grandfather 49       MI  . Hypertension Maternal Grandfather   . CAD Paternal Grandfather 39       MI  . Hypertension Paternal Grandfather     Allergies  Allergen Reactions  . Lactose Intolerance (Gi) Other (See Comments)    GI UPSET    Medication list reviewed and updated in full in [place] [facility].   GEN: No acute illnesses, no fevers, chills. GI: No n/v/d, eating normally Pulm: No SOB Interactive and getting along well at home.  Otherwise, ROS is as per the HPI.  Objective:   BP 90/64   Pulse 78   Temp 98.8 F (37.1 C) (Oral)   Ht 5' 5.5" (1.664 m)   Wt 223 lb 4 oz (101.3 kg)   LMP 01/05/2018   BMI 36.59 kg/m   GEN: WDWN, NAD, Non-toxic, A & O x 3 HEENT: Atraumatic, Normocephalic. Neck supple. No masses, No LAD. Ears and Nose: No external deformity. CV: RRR, No M/G/R. No JVD. No thrill. No extra heart sounds. PULM: CTA B, no wheezes, crackles, rhonchi. No retractions. No resp. distress. No accessory muscle use. EXTR: No c/c/e NEURO Normal gait.  PSYCH: Normally interactive. Conversant. Not depressed or anxious appearing.  Calm demeanor.   ANKLE: RIGHT Echymosis: no Edema: no ROM: Full dorsi and plantar flexion, inversion, eversion Gait: heel toe, non-antalgic Lateral Mall: NT Medial Mall: NT Talus: NT Navicular: NT Cuboid: NT Calcaneous: NT Metatarsals: NT 5th MT: NT Phalanges: NT Achilles: NT Plantar Fascia: NT Fat Pad: NT Peroneals: NT Post  Tib: NT Great Toe: Nml motion Ant Drawer: neg Talar Tilt: neg ATFL: NT CFL: NT Deltoid: NT Str: 5/5 Other Special tests: none Sensation: intact   Knee:  R Gait: Normal heel toe pattern ROM: 0-130 Effusion: neg Echymosis or edema: none Patellar tendon NT Painful PLICA: neg Patellar grind: negative Medial and lateral patellar facet loading: negative medial and lateral joint lines:NT Mcmurray's neg Flexion-pinch neg Varus and valgus stress:  stable Lachman: neg Ant and Post drawer: neg Hip abduction, IR, ER: WNL Hip flexion str: 5/5 Hip abd: 5/5 Quad: 5/5 VMO atrophy:No Hamstring concentric and eccentric: 5/5   Laboratory and Imaging Data:  Assessment and Plan:   Acute right ankle pain  Dog bite, initial encounter  12 days out from injury, fully healed, no redness, exam is very reassuring and tetanus is up-to-date.  Rabies is negative.  I reassured the patient, I really do not think she needs to do anything at all.  No limitation in terms of activity.  Follow-up: No follow-ups on file.  Meds ordered this encounter  Medications  . butalbital-acetaminophen-caffeine (FIORICET, ESGIC) 50-325-40 MG tablet    Sig: TAKE ONE TABLET BY MOUTH EVERY 6 HOURS AS NEEDED FOR HEADACHE    Dispense:  30 tablet    Refill:  0  . DULoxetine (CYMBALTA) 20 MG capsule    Sig: Take 1 capsule (20 mg total) by mouth 2 (two) times daily.    Dispense:  60 capsule    Refill:  6    Use this sig   Signed,  Aydon Swamy T. Zaccheus Edmister, MD   Allergies as of 02/01/2018      Reactions   Lactose Intolerance (gi) Other (See Comments)   GI UPSET      Medication List        Accurate as of 02/01/18 11:59 PM. Always use your most recent med list.          butalbital-acetaminophen-caffeine 50-325-40 MG tablet Commonly known as:  FIORICET, ESGIC TAKE ONE TABLET BY MOUTH EVERY 6 HOURS AS NEEDED FOR HEADACHE   DULoxetine 20 MG capsule Commonly known as:  CYMBALTA Take 1 capsule (20 mg total) by  mouth 2 (two) times daily.   LORazepam 0.5 MG tablet Commonly known as:  ATIVAN Take 0.5-1 tablets (0.25-0.5 mg total) by mouth 2 (two) times daily as needed for anxiety.   norethindrone 0.35 MG tablet Commonly known as:  MICRONOR,CAMILA,ERRIN TAKE 1 TABLET BY MOUTH EVERY DAY   SUMAtriptan 50 MG tablet Commonly known as:  IMITREX TAKE 0.5-1 TABLET BY MOUTH ONCE FOR 1 DOSE. MAY REPEAT IN 2 HOURS IF HEADACHE PERSISTS OR RECURS.

## 2018-02-02 ENCOUNTER — Encounter: Payer: Self-pay | Admitting: Family Medicine

## 2018-02-08 ENCOUNTER — Ambulatory Visit: Payer: Self-pay | Admitting: Psychology

## 2018-03-22 DIAGNOSIS — K080 Exfoliation of teeth due to systemic causes: Secondary | ICD-10-CM | POA: Diagnosis not present

## 2018-04-19 ENCOUNTER — Other Ambulatory Visit: Payer: Self-pay | Admitting: Obstetrics and Gynecology

## 2018-04-19 DIAGNOSIS — Z30011 Encounter for initial prescription of contraceptive pills: Secondary | ICD-10-CM

## 2018-04-24 ENCOUNTER — Ambulatory Visit: Payer: Federal, State, Local not specified - PPO | Admitting: Psychology

## 2018-04-24 DIAGNOSIS — F411 Generalized anxiety disorder: Secondary | ICD-10-CM

## 2018-05-07 ENCOUNTER — Other Ambulatory Visit: Payer: Self-pay | Admitting: Obstetrics and Gynecology

## 2018-05-07 DIAGNOSIS — Z30011 Encounter for initial prescription of contraceptive pills: Secondary | ICD-10-CM

## 2018-05-09 ENCOUNTER — Ambulatory Visit: Payer: Federal, State, Local not specified - PPO | Admitting: Psychology

## 2018-05-09 DIAGNOSIS — F411 Generalized anxiety disorder: Secondary | ICD-10-CM | POA: Diagnosis not present

## 2018-05-10 ENCOUNTER — Emergency Department: Payer: Federal, State, Local not specified - PPO

## 2018-05-10 ENCOUNTER — Telehealth: Payer: Self-pay | Admitting: *Deleted

## 2018-05-10 ENCOUNTER — Emergency Department
Admission: EM | Admit: 2018-05-10 | Discharge: 2018-05-10 | Disposition: A | Discharge status: Home / Self Care | Payer: Federal, State, Local not specified - PPO | Attending: Emergency Medicine | Admitting: Emergency Medicine

## 2018-05-10 ENCOUNTER — Encounter: Payer: Self-pay | Admitting: Medical Oncology

## 2018-05-10 DIAGNOSIS — R002 Palpitations: Secondary | ICD-10-CM | POA: Diagnosis not present

## 2018-05-10 DIAGNOSIS — Z79899 Other long term (current) drug therapy: Secondary | ICD-10-CM | POA: Diagnosis not present

## 2018-05-10 DIAGNOSIS — R0602 Shortness of breath: Secondary | ICD-10-CM | POA: Diagnosis not present

## 2018-05-10 LAB — CBC
HEMATOCRIT: 39.7 % (ref 36.0–46.0)
Hemoglobin: 12.8 g/dL (ref 12.0–15.0)
MCH: 29.1 pg (ref 26.0–34.0)
MCHC: 32.2 g/dL (ref 30.0–36.0)
MCV: 90.2 fL (ref 80.0–100.0)
Platelets: 292 10*3/uL (ref 150–400)
RBC: 4.4 MIL/uL (ref 3.87–5.11)
RDW: 12.9 % (ref 11.5–15.5)
WBC: 8.7 10*3/uL (ref 4.0–10.5)
nRBC: 0 % (ref 0.0–0.2)

## 2018-05-10 LAB — BASIC METABOLIC PANEL
Anion gap: 9 (ref 5–15)
BUN: 13 mg/dL (ref 6–20)
CHLORIDE: 107 mmol/L (ref 98–111)
CO2: 25 mmol/L (ref 22–32)
CREATININE: 0.9 mg/dL (ref 0.44–1.00)
Calcium: 9.5 mg/dL (ref 8.9–10.3)
GFR calc Af Amer: >60 mL/min (ref >60)
Glucose, Bld: 139 mg/dL — ABNORMAL HIGH (ref 70–99)
Potassium: 4 mmol/L (ref 3.5–5.1)
Sodium: 141 mmol/L (ref 135–145)

## 2018-05-10 LAB — TROPONIN I: Troponin I: <0.03 ng/mL (ref <0.03)

## 2018-05-10 MED ORDER — DULOXETINE HCL 20 MG PO CPEP: 20.0000 mg | ORAL_CAPSULE | Freq: Two times a day (BID) | ORAL | 6 refills | Status: DC

## 2018-05-10 MED ORDER — METOPROLOL TARTRATE 25 MG PO TABS: 25.0000 mg | ORAL_TABLET | Freq: Two times a day (BID) | ORAL | 0 refills | Status: DC | PRN

## 2018-05-10 NOTE — ED Triage Notes (Signed)
Pt reports for the past 3 days she has been feeling like her heart is racing and having palpitations. Pt reports that she occasionally will have pressure to chest and into left arm. Reports sob at times also. Pt appears anxious in triage.

## 2018-05-10 NOTE — Telephone Encounter (Signed)
Spoke to pt who states that she is experiencing palpitations and dizziness x2d accompanied by [facility] and vision changes. She reports that she has a Hx of migraines and is unsure if this is "turning in to one." Positive for night sweats. Pt was wanting an appt tomorrow, but based on s/s, pt advised to go directly to ED; states she will go to [facility]. FYI to Dr Darnell Level

## 2018-05-10 NOTE — ED Provider Notes (Signed)
[facility] Emergency Department Provider Note   ____________________________________________    I have reviewed the triage vital signs and the nursing notes.   HISTORY  Chief Complaint Palpitations     HPI Amanda Stout is a 42 y.o. female who presents with complaints of palpitations.  Patient reports over the last 2 weeks she has had more frequent occurrences of palpitations.  She describes these events as a feeling of a racing heart followed by lightheadedness.  Sometimes it can last up to an hour and then improved.  She reports overall this is been occurring over the last 2 years, she has attributed this to panic attacks.  Currently she feels well, denies chest pain.  No nausea vomiting.  No pleurisy.  No calf pain or swelling.  No recent travel.  Has never seen a cardiologist regarding this.   Past Medical History:  Diagnosis Date  . ACL injury tear 04/2015   right  . Arthritis    L4-5  . Chronic migraine   . Exposure to [place] virus 09/29/2016   Zika NAA urine serum done  . Lower back pain 2010   s/p herniated disc  . Migraine with aura 05/2017   since 2018 pregnancy  . Perennial allergic rhinitis     Patient Active Problem List   Diagnosis Date Noted  . Left otitis media 07/03/2017  . Jaw pain 07/03/2017  . Right otitis media 06/16/2017  . Numbness and tingling in right hand 02/22/2017  . Depression, postpartum 02/22/2017  . Postpartum care following vaginal delivery 01/29/2017  . Fetal arrhythmia before the onset of labor 01/02/2017  . Dizziness 11/01/2016  . Exposure to [place] virus 09/29/2016  . Supervision of high risk pregnancy, antepartum, third trimester 09/26/2016  . BMI 32.0-32.9,adult 09/26/2016  . Group beta Strep positive 07/11/2016  . Rubella non-immune status, antepartum 07/08/2016  . Anxiety attack 03/09/2016  . Myalgia 01/21/2016  . Acute sinusitis 05/01/2015  . Complete tear of right ACL 04/10/2015  . Perennial  allergic rhinitis   . Migraine with typical aura   . MDD (major depressive disorder), recurrent episode, moderate ([place])   . Lower back pain   . Lactose intolerance in adult     Past Surgical History:  Procedure Laterality Date  . KNEE ARTHROSCOPY WITH ANTERIOR CRUCIATE LIGAMENT (ACL) REPAIR Right 04/10/2015   Marchia Bond, MD;  [place]  . [place]    Prior to Admission medications   Medication Sig Start Date End Date Taking? Authorizing Provider  butalbital-acetaminophen-caffeine (FIORICET, ESGIC) 50-325-40 MG tablet TAKE ONE TABLET BY MOUTH EVERY 6 HOURS AS NEEDED FOR HEADACHE 02/01/18   Copland, Frederico Hamman, MD  DULoxetine (CYMBALTA) 20 MG capsule Take 1 capsule (20 mg total) by mouth 2 (two) times daily. 05/10/18   Lavonia Drafts, MD  LORazepam (ATIVAN) 0.5 MG tablet Take 0.5-1 tablets (0.25-0.5 mg total) by mouth 2 (two) times daily as needed for anxiety. 06/07/17   Ria Bush, MD  metoprolol tartrate (LOPRESSOR) 25 MG tablet Take 1 tablet (25 mg total) by mouth 2 (two) times daily as needed (for palpitations). 05/10/18 05/10/19  Lavonia Drafts, MD  norethindrone (MICRONOR,CAMILA,ERRIN) 0.35 MG tablet TAKE 1 TABLET BY MOUTH EVERY DAY 0/17/51   Copland, Elmo Putt B, PA-C  SUMAtriptan (IMITREX) 50 MG tablet TAKE 0.5-1 TABLET BY MOUTH ONCE FOR 1 DOSE. MAY REPEAT IN 2 HOURS IF HEADACHE PERSISTS OR RECURS. 02/01/18   Ria Bush, MD  Allergies Lactose intolerance (gi)  Family History  Problem Relation Age of Onset  . Cancer Mother 100       uterine  . Hyperlipidemia Father   . Hypertension Father   . Arrhythmia Father   . Stroke Maternal Grandmother   . CAD Maternal Grandfather 76       MI  . Hypertension Maternal Grandfather   . CAD Paternal Grandfather 64       MI  . Hypertension Paternal Grandfather     Social History Social History   Tobacco Use  . Smoking status: Never Smoker  . Smokeless tobacco: Never Used  Substance Use  Topics  . Alcohol use: Yes    Comment: 2 x/week  . Drug use: No    Review of Systems  Constitutional: No fever/chills Eyes: No visual changes.  ENT: No throat swelling Cardiovascular: As above Respiratory: Denies shortness of breath. Gastrointestinal: No abdominal pain.  No nausea, no vomiting.   Genitourinary: Negative for frequency Musculoskeletal: Negative for back pain. Skin: Negative for pallor Neurological: Negative for headaches    ____________________________________________   PHYSICAL EXAM:  VITAL SIGNS: ED Triage Vitals [05/10/18 1321]  Enc Vitals Group     BP 122/75     Pulse Rate 86     Resp 16     Temp (!) 97.4 F (36.3 C)     Temp Source Oral     SpO2 98 %     Weight 98.9 kg (218 lb)     Height 1.676 m (5\' 6" )     Head Circumference      Peak Flow      Pain Score 0     Pain Loc      Pain Edu?      Excl. in [place]?     Constitutional: Alert and oriented. No acute distress.  Eyes: Conjunctivae are normal.   Nose: No congestion/rhinnorhea. Mouth/Throat: Mucous membranes are moist.   Neck: Thyroid appears normal Cardiovascular: Normal rate, regular rhythm. Grossly normal heart sounds.  Good peripheral circulation. Respiratory: Normal respiratory effort.  No retractions. Lungs CTAB. Gastrointestinal: Soft and nontender. No distention.  Musculoskeletal: No lower extremity tenderness nor edema.  Warm and well perfused Neurologic:  Normal speech and language. No gross focal neurologic deficits are appreciated.  Skin:  Skin is warm, dry and intact. No rash noted. Psychiatric: Mood and affect are normal. Speech and behavior are normal.  ____________________________________________   LABS (all labs ordered are listed, but only abnormal results are displayed)  Labs Reviewed  BASIC METABOLIC PANEL - Abnormal; Notable for the following components:      Result Value   Glucose, Bld 139 (*)    All other components within normal limits  CBC  TROPONIN I    POC URINE PREG, ED   ____________________________________________  EKG  ED ECG REPORT I, Lavonia Drafts, the attending physician, personally viewed and interpreted this ECG.   Rhythm: normal sinus rhythm QRS Axis: normal Intervals: normal ST/T Wave abnormalities: normal Narrative Interpretation: no evidence of acute ischemia  ____________________________________________  RADIOLOGY  Chest x-ray normal ____________________________________________   PROCEDURES  Procedure(s) performed: No  Procedures   Critical Care performed: No ____________________________________________   INITIAL IMPRESSION / ASSESSMENT AND PLAN / ED COURSE  Pertinent labs & imaging results that were available during my care of the patient were reviewed by me and considered in my medical decision making (see chart for details).  Patient well-appearing in no acute distress.  Her heart rate is  normal here.  She is not having symptoms here.  Lab work is quite reassuring, normal troponin, EKG normal, no PVCs or delta waves.  She has attributed this to panic attacks although I doubt this given no other symptoms besides palpitations.  Possibly SVT.  Discussed with her follow-up with cardiology, possible Holter monitoring.  Will prescribe p.o. metoprolol as needed for now.  Patient agrees with this plan    FINAL CLINICAL IMPRESSION(S) / ED DIAGNOSES  Final diagnoses:  Palpitations        Note:  This document was prepared using Dragon voice recognition software and may include unintentional dictation errors.    Lavonia Drafts, MD 05/10/18 [phone]

## 2018-05-15 DIAGNOSIS — K080 Exfoliation of teeth due to systemic causes: Secondary | ICD-10-CM | POA: Diagnosis not present

## 2018-05-16 ENCOUNTER — Ambulatory Visit: Payer: Federal, State, Local not specified - PPO | Admitting: Psychology

## 2018-05-16 DIAGNOSIS — F411 Generalized anxiety disorder: Secondary | ICD-10-CM | POA: Diagnosis not present

## 2018-05-21 ENCOUNTER — Ambulatory Visit: Payer: Federal, State, Local not specified - PPO | Admitting: Psychology

## 2018-05-21 DIAGNOSIS — F411 Generalized anxiety disorder: Secondary | ICD-10-CM | POA: Diagnosis not present

## 2018-06-06 ENCOUNTER — Ambulatory Visit: Payer: Federal, State, Local not specified - PPO | Admitting: Psychology

## 2018-06-06 DIAGNOSIS — F411 Generalized anxiety disorder: Secondary | ICD-10-CM | POA: Diagnosis not present

## 2018-06-12 ENCOUNTER — Ambulatory Visit: Payer: Federal, State, Local not specified - PPO | Admitting: Psychology

## 2018-06-12 DIAGNOSIS — F411 Generalized anxiety disorder: Secondary | ICD-10-CM | POA: Diagnosis not present

## 2018-06-14 NOTE — Telephone Encounter (Signed)
Plz touch  Base with patient after ER eval last month - any further palpitation episodes? If so, could consider cards eval.

## 2018-06-14 NOTE — Telephone Encounter (Signed)
Left message on vm for pt to call back.  Need to relay Dr. G's message.  

## 2018-06-15 ENCOUNTER — Other Ambulatory Visit: Payer: Self-pay | Admitting: Family Medicine

## 2018-06-15 NOTE — Telephone Encounter (Signed)
Spoke with pt for updated since ER visit.  Says she has had more palpitations but has noticed they only occur on days she takes Imitrex for migraines.  Pt is asking if there is anything else she can take in place of Imitrex. Plz advise.  Pt gives permission to lvm.

## 2018-06-16 MED ORDER — FROVATRIPTAN SUCCINATE 2.5 MG PO TABS: 2.5000 mg | ORAL_TABLET | ORAL | 6 refills | Status: DC | PRN

## 2018-06-16 NOTE — Addendum Note (Signed)
Addended by: Ria Bush on: 06/16/2018 11:16 AM   Modules accepted: Orders

## 2018-06-16 NOTE — Telephone Encounter (Addendum)
I believe insurance wouldn't cover more than 4 relpax per month when we tried previously, right? We could try frovatriptan which is slower onset and may cause less side effects. I've sent in frovatriptan to price out.  [slowest onset - frovatriptan, naratriptan]

## 2018-06-19 NOTE — Telephone Encounter (Signed)
Attempted to contact pt. No answer. Vm did not beep.   Need to relay Dr. Synthia Innocent message.

## 2018-06-20 ENCOUNTER — Ambulatory Visit: Payer: Federal, State, Local not specified - PPO | Admitting: Psychology

## 2018-06-20 DIAGNOSIS — F411 Generalized anxiety disorder: Secondary | ICD-10-CM

## 2018-06-21 NOTE — Telephone Encounter (Signed)
Attempted to contact pt. No answer. Vm did not beep.   Need to relay Dr. Synthia Innocent message.

## 2018-06-25 NOTE — Telephone Encounter (Signed)
Attempted several times to contact pt with no response. Mailed letter to pt.

## 2018-07-05 ENCOUNTER — Ambulatory Visit: Payer: Federal, State, Local not specified - PPO | Admitting: Psychology

## 2018-07-05 DIAGNOSIS — F411 Generalized anxiety disorder: Secondary | ICD-10-CM | POA: Diagnosis not present

## 2018-07-11 ENCOUNTER — Ambulatory Visit: Payer: Federal, State, Local not specified - PPO | Admitting: Psychology

## 2018-07-11 DIAGNOSIS — F411 Generalized anxiety disorder: Secondary | ICD-10-CM

## 2018-07-19 ENCOUNTER — Ambulatory Visit: Payer: Federal, State, Local not specified - PPO | Admitting: Psychology

## 2018-07-19 DIAGNOSIS — F411 Generalized anxiety disorder: Secondary | ICD-10-CM | POA: Diagnosis not present

## 2018-07-26 ENCOUNTER — Ambulatory Visit: Payer: Federal, State, Local not specified - PPO | Admitting: Psychology

## 2018-07-26 DIAGNOSIS — F411 Generalized anxiety disorder: Secondary | ICD-10-CM | POA: Diagnosis not present

## 2018-08-02 ENCOUNTER — Ambulatory Visit: Payer: Federal, State, Local not specified - PPO | Admitting: Psychology

## 2018-08-02 DIAGNOSIS — F411 Generalized anxiety disorder: Secondary | ICD-10-CM | POA: Diagnosis not present

## 2018-08-09 ENCOUNTER — Ambulatory Visit: Payer: Federal, State, Local not specified - PPO | Admitting: Psychology

## 2018-08-09 DIAGNOSIS — F411 Generalized anxiety disorder: Secondary | ICD-10-CM | POA: Diagnosis not present

## 2018-08-17 ENCOUNTER — Ambulatory Visit: Payer: Federal, State, Local not specified - PPO | Admitting: Psychology

## 2018-08-17 DIAGNOSIS — F411 Generalized anxiety disorder: Secondary | ICD-10-CM

## 2018-08-23 ENCOUNTER — Ambulatory Visit: Payer: Federal, State, Local not specified - PPO | Admitting: Psychology

## 2018-08-23 DIAGNOSIS — F411 Generalized anxiety disorder: Secondary | ICD-10-CM | POA: Diagnosis not present

## 2018-08-30 ENCOUNTER — Ambulatory Visit: Payer: Federal, State, Local not specified - PPO | Admitting: Psychology

## 2018-08-30 DIAGNOSIS — F411 Generalized anxiety disorder: Secondary | ICD-10-CM

## 2018-09-03 ENCOUNTER — Ambulatory Visit: Payer: Federal, State, Local not specified - PPO | Admitting: Psychology

## 2018-09-03 DIAGNOSIS — F411 Generalized anxiety disorder: Secondary | ICD-10-CM

## 2018-09-04 ENCOUNTER — Other Ambulatory Visit: Payer: Self-pay | Admitting: Family Medicine

## 2018-09-05 NOTE — Telephone Encounter (Signed)
Rx not on current med list.  Do you want to send new rx?  Flonase Last OV:  02/01/18, acute w/ Dr. Lorelei Pont Next OV:  none

## 2018-09-06 ENCOUNTER — Ambulatory Visit: Payer: Federal, State, Local not specified - PPO | Admitting: Psychology

## 2018-09-10 ENCOUNTER — Ambulatory Visit: Payer: Federal, State, Local not specified - PPO | Admitting: Psychology

## 2018-09-14 ENCOUNTER — Ambulatory Visit: Payer: Federal, State, Local not specified - PPO | Admitting: Psychology

## 2018-09-17 ENCOUNTER — Ambulatory Visit (INDEPENDENT_AMBULATORY_CARE_PROVIDER_SITE_OTHER): Payer: Federal, State, Local not specified - PPO | Admitting: Psychology

## 2018-09-17 DIAGNOSIS — F411 Generalized anxiety disorder: Secondary | ICD-10-CM | POA: Diagnosis not present

## 2018-09-20 ENCOUNTER — Ambulatory Visit: Payer: Federal, State, Local not specified - PPO | Admitting: Psychology

## 2018-09-24 ENCOUNTER — Ambulatory Visit: Payer: Federal, State, Local not specified - PPO | Admitting: Psychology

## 2018-09-24 ENCOUNTER — Ambulatory Visit (INDEPENDENT_AMBULATORY_CARE_PROVIDER_SITE_OTHER): Payer: Federal, State, Local not specified - PPO | Admitting: Psychology

## 2018-09-24 DIAGNOSIS — F411 Generalized anxiety disorder: Secondary | ICD-10-CM | POA: Diagnosis not present

## 2018-09-27 ENCOUNTER — Ambulatory Visit: Payer: Federal, State, Local not specified - PPO | Admitting: Psychology

## 2018-10-01 ENCOUNTER — Ambulatory Visit (INDEPENDENT_AMBULATORY_CARE_PROVIDER_SITE_OTHER): Payer: Federal, State, Local not specified - PPO | Admitting: Psychology

## 2018-10-01 DIAGNOSIS — F411 Generalized anxiety disorder: Secondary | ICD-10-CM | POA: Diagnosis not present

## 2018-10-15 ENCOUNTER — Ambulatory Visit: Payer: Federal, State, Local not specified - PPO | Admitting: Psychology

## 2018-10-18 ENCOUNTER — Ambulatory Visit (INDEPENDENT_AMBULATORY_CARE_PROVIDER_SITE_OTHER): Payer: Federal, State, Local not specified - PPO | Admitting: Psychology

## 2018-10-18 DIAGNOSIS — F411 Generalized anxiety disorder: Secondary | ICD-10-CM

## 2018-10-22 ENCOUNTER — Ambulatory Visit: Payer: Federal, State, Local not specified - PPO | Admitting: Psychology

## 2018-10-29 ENCOUNTER — Ambulatory Visit (INDEPENDENT_AMBULATORY_CARE_PROVIDER_SITE_OTHER): Payer: Federal, State, Local not specified - PPO | Admitting: Psychology

## 2018-10-29 DIAGNOSIS — F411 Generalized anxiety disorder: Secondary | ICD-10-CM | POA: Diagnosis not present

## 2018-11-05 ENCOUNTER — Ambulatory Visit (INDEPENDENT_AMBULATORY_CARE_PROVIDER_SITE_OTHER): Payer: Federal, State, Local not specified - PPO | Admitting: Psychology

## 2018-11-05 DIAGNOSIS — F411 Generalized anxiety disorder: Secondary | ICD-10-CM

## 2018-11-12 ENCOUNTER — Ambulatory Visit: Payer: Federal, State, Local not specified - PPO | Admitting: Psychology

## 2018-11-19 ENCOUNTER — Ambulatory Visit: Payer: Federal, State, Local not specified - PPO | Admitting: Psychology

## 2018-12-03 ENCOUNTER — Ambulatory Visit (INDEPENDENT_AMBULATORY_CARE_PROVIDER_SITE_OTHER): Payer: Federal, State, Local not specified - PPO | Admitting: Psychology

## 2018-12-03 DIAGNOSIS — F411 Generalized anxiety disorder: Secondary | ICD-10-CM

## 2018-12-10 ENCOUNTER — Ambulatory Visit: Payer: Federal, State, Local not specified - PPO | Admitting: Psychology

## 2018-12-17 ENCOUNTER — Ambulatory Visit (INDEPENDENT_AMBULATORY_CARE_PROVIDER_SITE_OTHER): Payer: Federal, State, Local not specified - PPO | Admitting: Psychology

## 2018-12-17 DIAGNOSIS — F411 Generalized anxiety disorder: Secondary | ICD-10-CM

## 2018-12-24 ENCOUNTER — Ambulatory Visit: Payer: Federal, State, Local not specified - PPO | Admitting: Psychology

## 2018-12-27 ENCOUNTER — Telehealth: Payer: Self-pay | Admitting: Family Medicine

## 2018-12-27 NOTE — Telephone Encounter (Signed)
Ok with doing Pap smear/annual, though may recommend that she discuss Migraine management with PCP depending on time.   Lesleigh Noe

## 2018-12-27 NOTE — Telephone Encounter (Signed)
Nice patient.  Ok to do if a provider in office agrees to do this.

## 2018-12-27 NOTE — Telephone Encounter (Signed)
Patient called requesting an appointment to have a PAP smear done. She stated she would prefer to have a female provider do this. And would also like to discuss medications for margines when she comes in for the appointment.   Is this okay to schedule with one of the female providers in the office.

## 2018-12-28 NOTE — Telephone Encounter (Signed)
Called to schedule patient with Dr Einar Pheasant for Annual Pap.  V/m was not set up and could not leave a message

## 2018-12-31 ENCOUNTER — Ambulatory Visit (INDEPENDENT_AMBULATORY_CARE_PROVIDER_SITE_OTHER): Payer: Federal, State, Local not specified - PPO | Admitting: Psychology

## 2018-12-31 DIAGNOSIS — F411 Generalized anxiety disorder: Secondary | ICD-10-CM | POA: Diagnosis not present

## 2019-01-07 ENCOUNTER — Ambulatory Visit (INDEPENDENT_AMBULATORY_CARE_PROVIDER_SITE_OTHER): Payer: Federal, State, Local not specified - PPO | Admitting: Family Medicine

## 2019-01-07 ENCOUNTER — Encounter: Payer: Self-pay | Admitting: Family Medicine

## 2019-01-07 ENCOUNTER — Other Ambulatory Visit: Payer: Self-pay

## 2019-01-07 VITALS — BP 108/74 | HR 76 | Temp 97.8°F | Ht 65.75 in | Wt 223.0 lb

## 2019-01-07 DIAGNOSIS — Z30011 Encounter for initial prescription of contraceptive pills: Secondary | ICD-10-CM

## 2019-01-07 DIAGNOSIS — G43109 Migraine with aura, not intractable, without status migrainosus: Secondary | ICD-10-CM | POA: Diagnosis not present

## 2019-01-07 MED ORDER — NORETHINDRONE 0.35 MG PO TABS: 1.0000 | ORAL_TABLET | Freq: Every day | ORAL | 11 refills | Status: DC

## 2019-01-07 MED ORDER — SUMATRIPTAN SUCCINATE 50 MG PO TABS: ORAL_TABLET | ORAL | 3 refills | Status: DC

## 2019-01-07 NOTE — Patient Instructions (Signed)
Great to meet you today!  #Birth control - start the progesterone only pill - if noticing side effects or wanting to try something different let me know  #Migraines - take Excedrin migraine at the start of headache - then take the Imitrex if needed - if happening more often, let me know   We will plan to check cholesterol and diabetes screen next year   Preventive Care 11-41 Years Old, Female Preventive care refers to visits with your health care provider and lifestyle choices that can promote health and wellness. This includes:  A yearly physical exam. This may also be called an annual well check.  Regular dental visits and eye exams.  Immunizations.  Screening for certain conditions.  Healthy lifestyle choices, such as eating a healthy diet, getting regular exercise, not using drugs or products that contain nicotine and tobacco, and limiting alcohol use. What can I expect for my preventive care visit? Physical exam Your health care provider will check your:  Height and weight. This may be used to calculate body mass index (BMI), which tells if you are at a healthy weight.  Heart rate and blood pressure.  Skin for abnormal spots. Counseling Your health care provider may ask you questions about your:  Alcohol, tobacco, and drug use.  Emotional well-being.  Home and relationship well-being.  Sexual activity.  Eating habits.  Work and work Statistician.  Method of birth control.  Menstrual cycle.  Pregnancy history. What immunizations do I need?  Influenza (flu) vaccine  This is recommended every year. Tetanus, diphtheria, and pertussis (Tdap) vaccine  You may need a Td booster every 10 years. Varicella (chickenpox) vaccine  You may need this if you have not been vaccinated. Zoster (shingles) vaccine  You may need this after age 74. Measles, mumps, and rubella (MMR) vaccine  You may need at least one dose of MMR if you were born in 1957 or later.  You may also need a second dose. Pneumococcal conjugate (PCV13) vaccine  You may need this if you have certain conditions and were not previously vaccinated. Pneumococcal polysaccharide (PPSV23) vaccine  You may need one or two doses if you smoke cigarettes or if you have certain conditions. Meningococcal conjugate (MenACWY) vaccine  You may need this if you have certain conditions. Hepatitis A vaccine  You may need this if you have certain conditions or if you travel or work in places where you may be exposed to hepatitis A. Hepatitis B vaccine  You may need this if you have certain conditions or if you travel or work in places where you may be exposed to hepatitis B. Haemophilus influenzae type b (Hib) vaccine  You may need this if you have certain conditions. Human papillomavirus (HPV) vaccine  If recommended by your health care provider, you may need three doses over 6 months. You may receive vaccines as individual doses or as more than one vaccine together in one shot (combination vaccines). Talk with your health care provider about the risks and benefits of combination vaccines. What tests do I need? Blood tests  Lipid and cholesterol levels. These may be checked every 5 years, or more frequently if you are over 20 years old.  Hepatitis C test.  Hepatitis B test. Screening  Lung cancer screening. You may have this screening every year starting at age 3 if you have a 30-pack-year history of smoking and currently smoke or have quit within the past 15 years.  Colorectal cancer screening. All adults should have  this screening starting at age 53 and continuing until age 70. Your health care provider may recommend screening at age 79 if you are at increased risk. You will have tests every 1-10 years, depending on your results and the type of screening test.  Diabetes screening. This is done by checking your blood sugar (glucose) after you have not eaten for a while (fasting).  You may have this done every 1-3 years.  Mammogram. This may be done every 1-2 years. Talk with your health care provider about when you should start having regular mammograms. This may depend on whether you have a family history of breast cancer.  BRCA-related cancer screening. This may be done if you have a family history of breast, ovarian, tubal, or peritoneal cancers.  Pelvic exam and Pap test. This may be done every 3 years starting at age 19. Starting at age 22, this may be done every 5 years if you have a Pap test in combination with an HPV test. Other tests  Sexually transmitted disease (STD) testing.  Bone density scan. This is done to screen for osteoporosis. You may have this scan if you are at high risk for osteoporosis. Follow these instructions at home: Eating and drinking  Eat a diet that includes fresh fruits and vegetables, whole grains, lean protein, and low-fat dairy.  Take vitamin and mineral supplements as recommended by your health care provider.  Do not drink alcohol if: ? Your health care provider tells you not to drink. ? You are pregnant, may be pregnant, or are planning to become pregnant.  If you drink alcohol: ? Limit how much you have to 0-1 drink a day. ? Be aware of how much alcohol is in your drink. In the [place] one drink equals one 12 oz bottle of beer (355 mL), one 5 oz glass of wine (148 mL), or one 1 oz glass of hard liquor (44 mL). Lifestyle  Take daily care of your teeth and gums.  Stay active. Exercise for at least 30 minutes on 5 or more days each week.  Do not use any products that contain nicotine or tobacco, such as cigarettes, e-cigarettes, and chewing tobacco. If you need help quitting, ask your health care provider.  If you are sexually active, practice safe sex. Use a condom or other form of birth control (contraception) in order to prevent pregnancy and STIs (sexually transmitted infections).  If told by your health care provider,  take low-dose aspirin daily starting at age 81. What's next?  Visit your health care provider once a year for a well check visit.  Ask your health care provider how often you should have your eyes and teeth checked.  Stay up to date on all vaccines. This information is not intended to replace advice given to you by your health care provider. Make sure you discuss any questions you have with your health care provider. Document Released: 07/17/2015 Document Revised: 03/01/2018 Document Reviewed: 03/01/2018 Elsevier Patient Education  2020 [employer].

## 2019-01-07 NOTE — Assessment & Plan Note (Signed)
Refilled medication. Due to aura cannot do birth control w/ estrogen b/c of stroke risk. Will start progesterone -only pill.

## 2019-01-07 NOTE — Progress Notes (Signed)
Annual Exam  PCP: Lesleigh Noe, MD  Chief Complaint:  Chief Complaint  Patient presents with  . Annual Exam    discuss getting birth control    History of Present Illness:  Ms. Amanda Stout is a 43 y.o. G1P1001 who LMP was Patient's last menstrual period was 12/28/2018., presents today for her annual examination.     Nutrition/Lifestyle Diet: working on trying to eat health - single mom with a small child, processed food  Exercise: not active - walking dogs  Social History   Tobacco Use  Smoking Status Never Smoker  Smokeless Tobacco Never Used   Social History   Substance and Sexual Activity  Alcohol Use Yes   Comment: 3 x week, 1 serving of alcohol   Social History   Substance and Sexual Activity  Drug Use No    Safety The patient wears seatbelts: yes.     The patient feels safe at home and in their relationships: yes.  General Health Dentist in the last year: Yes Eye doctor: yes  Weight Wt Readings from Last 3 Encounters:  01/07/19 223 lb (101.2 kg)  05/10/18 218 lb (98.9 kg)  02/01/18 223 lb 4 oz (101.3 kg)   Patient has high BMI  BMI Readings from Last 1 Encounters:  01/07/19 36.27 kg/m     Chronic disease screening Blood pressure monitoring:  BP Readings from Last 3 Encounters:  01/07/19 108/74  05/10/18 (!) 147/90  02/01/18 90/64    Lipid Monitoring: Indication for screening: age >35, obesity, diabetes, family hx, CV risk factors.  Lipid screening: Not Indicated  Lab Results  Component Value Date   CHOL 149 10/31/2014   HDL 45.10 10/31/2014   LDLCALC 93 10/31/2014   TRIG 57.0 10/31/2014   CHOLHDL 3 10/31/2014     Diabetes Screening: age >48, overweight, family hx, PCOS, hx of gestational diabetes, at risk ethnicity, elevated blood pressure >135/80.  Diabetes Screening screening: will defer to next year  No results found for: HGBA1C  Menstrual Her menses are regular every 28-30 days, lasting 7 day(s).   Dysmenorrhea  occasionally, and some heavy bleeding. She does not have intermenstrual bleeding.  Has been on birth control for years. Tried IUD but it was malpositioned  Chronic migraines  Was on Yaz in the past and the nuva ring w/o a lot of luck with that IUD did not help - goal is lighter periods and shorter periods  GYN She is not sexually active.  Last Pap (Age 56-65): 07/07/2016 Results were: no abnormalities /neg HPV DNA    Breast Cancer Screening There is no FH of breast cancer. There is no FH of ovarian cancer. BRCA screening Not Indicated.  Discussed that for average risk women between age 43-49 screening may reduce the risk of breast cancer death, however, at a lower rate than those over age 34. And that the the false-positive rates resulting in unnecessary biopsies with more screening is higher. The balance of benefits vs harms likely improves as you progress through your 40s. The patient does not want a mammogram this year.     Past Medical History:  Diagnosis Date  . ACL injury tear 04/2015   right  . Arthritis    L4-5  . Chronic migraine   . Exposure to [place] virus 09/29/2016   Zika NAA urine serum done  . Lower back pain 2010   s/p herniated disc  . Migraine with aura 05/2017   since 2018 pregnancy  . Perennial allergic  rhinitis     Past Surgical History:  Procedure Laterality Date  . KNEE ARTHROSCOPY WITH ANTERIOR CRUCIATE LIGAMENT (ACL) REPAIR Right 04/10/2015   Marchia Bond, MD;  [place]  . [place]    Prior to Admission medications   Medication Sig Start Date End Date Taking? Authorizing Provider  aspirin-acetaminophen-caffeine (EXCEDRIN MIGRAINE) [phone] MG tablet Take by mouth every 6 (six) hours as needed for headache.   Yes [provider]  butalbital-acetaminophen-caffeine (FIORICET, ESGIC) 50-325-40 MG tablet TAKE ONE TABLET BY MOUTH EVERY 6 HOURS AS NEEDED FOR HEADACHE 02/01/18  Yes Copland, Frederico Hamman, MD   DULoxetine (CYMBALTA) 20 MG capsule TAKE 1 CAPSULE BY MOUTH EVERY DAY 06/15/18  Yes Ria Bush, MD  ibuprofen (ADVIL) 200 MG tablet Take 200 mg by mouth every 6 (six) hours as needed.   Yes [provider]  Loratadine-Pseudoephedrine (CLARITIN-D 24 HOUR PO) Take by mouth.   Yes [provider]  metoprolol tartrate (LOPRESSOR) 25 MG tablet Take 1 tablet (25 mg total) by mouth 2 (two) times daily as needed (for palpitations). 05/10/18 05/10/19 Yes Lavonia Drafts, MD  SUMAtriptan (IMITREX) 50 MG tablet TAKE 0.5-1 TABLET BY MOUTH ONCE FOR 1 DOSE. MAY REPEAT IN 2 HOURS IF HEADACHE PERSISTS OR RECURS. 02/01/18  Yes Ria Bush, MD    Allergies  Allergen Reactions  . Lactose Intolerance (Gi) Other (See Comments)    GI UPSET     Obstetric History: G1P1001  Social History   Socioeconomic History  . Marital status: Divorced    Spouse name: Not on file  . Number of children: 1  . Years of education: Not on file  . Highest education level: Not on file  Occupational History  . Not on file  Social Needs  . Financial resource strain: Not on file  . Food insecurity    Worry: Not on file    Inability: Not on file  . Transportation needs    Medical: Not on file    Non-medical: Not on file  Tobacco Use  . Smoking status: Never Smoker  . Smokeless tobacco: Never Used  Substance and Sexual Activity  . Alcohol use: Yes    Comment: 3 x week, 1 serving of alcohol  . Drug use: No  . Sexual activity: Not Currently    Birth control/protection: Pill  Lifestyle  . Physical activity    Days per week: Not on file    Minutes per session: Not on file  . Stress: Not on file  Relationships  . Social Herbalist on phone: Not on file    Gets together: Not on file    Attends religious service: Not on file    Active member of club or organization: Not on file    Attends meetings of clubs or organizations: Not on file    Relationship status: Not on file  .  Intimate partner violence    Fear of current or ex partner: Not on file    Emotionally abused: Not on file    Physically abused: Not on file    Forced sexual activity: Not on file  Other Topics Concern  . Not on file  Social History Narrative   Married 2016, has step son, 1 dog and 1 cat   Occupation: Passenger transport manager for [employer]   Edu: BS   Activity: walking dog, more intense exercises hurts back   Diet: good water, fruits/vegetables daily    Family History  Problem Relation Age of Onset  . Cancer Mother 74       uterine  . Hyperlipidemia Father   . Hypertension Father   . Arrhythmia Father   . Heart attack Father 22  . Stroke Maternal Grandmother   . CAD Maternal Grandfather 33       MI  . Hypertension Maternal Grandfather   . CAD Paternal Grandfather 79       MI  . Hypertension Paternal Grandfather     Review of Systems  Constitutional: Negative for chills and fever.  HENT: Negative.   Eyes: Negative.   Respiratory: Negative.   Cardiovascular: Negative.   Gastrointestinal: Negative.   Genitourinary: Negative.   Musculoskeletal: Positive for joint pain (foot pain).  Skin: Negative.   Neurological: Positive for headaches.  Endo/Heme/Allergies: Negative.   Psychiatric/Behavioral: Negative.      Physical Exam BP 108/74   Pulse 76   Temp 97.8 F (36.6 C)   Ht 5' 5.75" (1.67 m)   Wt 223 lb (101.2 kg)   LMP 12/28/2018   Breastfeeding No   BMI 36.27 kg/m    Physical Exam Constitutional:      General: She is not in acute distress.    Appearance: She is well-developed. She is not diaphoretic.  HENT:     Head: Normocephalic and atraumatic.     Right Ear: External ear normal.     Left Ear: External ear normal.     Nose: Nose normal.  Eyes:     General: No scleral icterus.    Conjunctiva/sclera: Conjunctivae normal.  Neck:     Musculoskeletal: Neck supple.  Cardiovascular:     Rate and Rhythm: Normal rate and regular rhythm.     Heart sounds: No murmur.   Pulmonary:     Effort: Pulmonary effort is normal. No respiratory distress.     Breath sounds: Normal breath sounds. No wheezing.  Abdominal:     General: Bowel sounds are normal. There is no distension.     Palpations: Abdomen is soft. There is no mass.     Tenderness: There is no abdominal tenderness. There is no guarding or rebound.  Musculoskeletal: Normal range of motion.  Lymphadenopathy:     Cervical: No cervical adenopathy.  Skin:    General: Skin is warm and dry.     Capillary Refill: Capillary refill takes less than 2 seconds.  Neurological:     Mental Status: She is alert and oriented to person, place, and time.     Deep Tendon Reflexes: Reflexes normal.  Psychiatric:        Behavior: Behavior normal.     Results: Alcohol use: low risk PHQ-9: in therapy and on medication, doing well   Office Visit from 07/03/2017 in Primary Care at [facility]  PHQ-9 Total Score  3        Assessment: 43 y.o. G16P1001 female here for routine annual examination  Plan: Problem List Items Addressed This Visit      Cardiovascular and Mediastinum   Migraine with typical aura - Primary    Refilled medication. Due to aura cannot do birth control w/ estrogen b/c of stroke risk. Will start progesterone -only pill.       Relevant Medications   aspirin-acetaminophen-caffeine (EXCEDRIN MIGRAINE) 250-250-65 MG tablet   ibuprofen (ADVIL) 200 MG tablet   SUMAtriptan (IMITREX) 50 MG tablet    Other Visit Diagnoses    Encounter for initial prescription of contraceptive pills       Relevant  Medications   norethindrone (MICRONOR) 0.35 MG tablet      Screening: -- Blood pressure screen normal -- cholesterol screening: will do next year -- Weight screening: overweight: continue to monitor -- Diabetes Screening: discussed getting next year, working on diet/exercise -- Nutrition: normal     Psych -- Depression screening (PHQ-9):    Office Visit from 07/03/2017 in Primary Care at [facility]   PHQ-9 Total Score  3       Safety -- tobacco screening: not using -- alcohol screening:  low-risk usage. -- no evidence of domestic violence or intimate partner violence.   Cancer Screening -- pap smear not collected per ASCCP guidelines -- family history of breast cancer screening: done. not at high risk.   Immunizations -- flu vaccine up to date -- TDAP q10 years up to date  Lesleigh Noe, MD

## 2019-01-14 ENCOUNTER — Ambulatory Visit (INDEPENDENT_AMBULATORY_CARE_PROVIDER_SITE_OTHER): Payer: Federal, State, Local not specified - PPO | Admitting: Psychology

## 2019-01-14 DIAGNOSIS — F411 Generalized anxiety disorder: Secondary | ICD-10-CM | POA: Diagnosis not present

## 2019-01-21 ENCOUNTER — Ambulatory Visit: Payer: Federal, State, Local not specified - PPO | Admitting: Psychology

## 2019-01-22 ENCOUNTER — Ambulatory Visit (INDEPENDENT_AMBULATORY_CARE_PROVIDER_SITE_OTHER): Payer: Federal, State, Local not specified - PPO | Admitting: Psychology

## 2019-01-22 DIAGNOSIS — F411 Generalized anxiety disorder: Secondary | ICD-10-CM

## 2019-01-28 ENCOUNTER — Ambulatory Visit (INDEPENDENT_AMBULATORY_CARE_PROVIDER_SITE_OTHER): Payer: Federal, State, Local not specified - PPO | Admitting: Psychology

## 2019-01-28 DIAGNOSIS — F411 Generalized anxiety disorder: Secondary | ICD-10-CM | POA: Diagnosis not present

## 2019-02-01 ENCOUNTER — Other Ambulatory Visit: Payer: Self-pay

## 2019-02-01 DIAGNOSIS — Z20822 Contact with and (suspected) exposure to covid-19: Secondary | ICD-10-CM

## 2019-02-01 DIAGNOSIS — R6889 Other general symptoms and signs: Secondary | ICD-10-CM | POA: Diagnosis not present

## 2019-02-03 LAB — NOVEL CORONAVIRUS, NAA: SARS-CoV-2, NAA: NOT DETECTED

## 2019-02-04 ENCOUNTER — Ambulatory Visit: Payer: Federal, State, Local not specified - PPO | Admitting: Psychology

## 2019-02-11 ENCOUNTER — Ambulatory Visit: Payer: Federal, State, Local not specified - PPO | Admitting: Psychology

## 2019-02-18 ENCOUNTER — Ambulatory Visit (INDEPENDENT_AMBULATORY_CARE_PROVIDER_SITE_OTHER): Payer: Federal, State, Local not specified - PPO | Admitting: Psychology

## 2019-02-18 DIAGNOSIS — F411 Generalized anxiety disorder: Secondary | ICD-10-CM | POA: Diagnosis not present

## 2019-02-25 ENCOUNTER — Ambulatory Visit: Payer: Self-pay | Admitting: Psychology

## 2019-02-28 ENCOUNTER — Ambulatory Visit: Payer: Federal, State, Local not specified - PPO | Admitting: Internal Medicine

## 2019-02-28 NOTE — Progress Notes (Deleted)
Subjective:    Patient ID: Amanda Stout, female    DOB: September 11, 1975, 43 y.o.   MRN: [id number]  HPI  Pt presents to the clinic today with c/o weight gain.  She also reports muscle fatigue. This started 1 year ago.  Review of Systems      Past Medical History:  Diagnosis Date  . ACL injury tear 04/2015   right  . Arthritis    L4-5  . Chronic migraine   . Exposure to [place] virus 09/29/2016   Zika NAA urine serum done  . Lower back pain 2010   s/p herniated disc  . Migraine with aura 05/2017   since 2018 pregnancy  . Perennial allergic rhinitis     Current Outpatient Medications  Medication Sig Dispense Refill  . aspirin-acetaminophen-caffeine (EXCEDRIN MIGRAINE) 250-250-65 MG tablet Take by mouth every 6 (six) hours as needed for headache.    . DULoxetine (CYMBALTA) 20 MG capsule TAKE 1 CAPSULE BY MOUTH EVERY DAY 60 capsule 1  . ibuprofen (ADVIL) 200 MG tablet Take 200 mg by mouth every 6 (six) hours as needed.    . Loratadine-Pseudoephedrine (CLARITIN-D 24 HOUR PO) Take by mouth.    . metoprolol tartrate (LOPRESSOR) 25 MG tablet Take 1 tablet (25 mg total) by mouth 2 (two) times daily as needed (for palpitations). 60 tablet 0  . norethindrone (MICRONOR) 0.35 MG tablet Take 1 tablet (0.35 mg total) by mouth daily. 1 Package 11  . SUMAtriptan (IMITREX) 50 MG tablet TAKE 0.5-1 TABLET BY MOUTH ONCE FOR 1 DOSE. MAY REPEAT IN 2 HOURS IF HEADACHE PERSISTS OR RECURS. 10 tablet 3   No current facility-administered medications for this visit.     Allergies  Allergen Reactions  . Lactose Intolerance (Gi) Other (See Comments)    GI UPSET    Family History  Problem Relation Age of Onset  . Cancer Mother 66       uterine  . Hyperlipidemia Father   . Hypertension Father   . Arrhythmia Father   . Heart attack Father 67  . Stroke Maternal Grandmother   . CAD Maternal Grandfather 30       MI  . Hypertension Maternal Grandfather   . CAD Paternal Grandfather 27       MI  .  Hypertension Paternal Grandfather     Social History   Socioeconomic History  . Marital status: Divorced    Spouse name: Not on file  . Number of children: 1  . Years of education: Not on file  . Highest education level: Not on file  Occupational History  . Not on file  Social Needs  . Financial resource strain: Not on file  . Food insecurity    Worry: Not on file    Inability: Not on file  . Transportation needs    Medical: Not on file    Non-medical: Not on file  Tobacco Use  . Smoking status: Never Smoker  . Smokeless tobacco: Never Used  Substance and Sexual Activity  . Alcohol use: Yes    Comment: 3 x week, 1 serving of alcohol  . Drug use: No  . Sexual activity: Not Currently    Birth control/protection: Pill  Lifestyle  . Physical activity    Days per week: Not on file    Minutes per session: Not on file  . Stress: Not on file  Relationships  . Social Herbalist on phone: Not on file    Gets  together: Not on file    Attends religious service: Not on file    Active member of club or organization: Not on file    Attends meetings of clubs or organizations: Not on file    Relationship status: Not on file  . Intimate partner violence    Fear of current or ex partner: Not on file    Emotionally abused: Not on file    Physically abused: Not on file    Forced sexual activity: Not on file  Other Topics Concern  . Not on file  Social History Narrative   Married 2016, has step son, 1 dog and 1 cat   Occupation: Passenger transport manager for [employer]   Edu: BS   Activity: walking dog, more intense exercises hurts back   Diet: good water, fruits/vegetables daily     Constitutional: Pt reports weight gain. Denies fever, malaise, fatigue, headache.  HEENT: Denies eye pain, eye redness, ear pain, ringing in the ears, wax buildup, runny nose, nasal congestion, bloody nose, or sore throat. Respiratory: Denies difficulty breathing, shortness of breath, cough or sputum  production.   Cardiovascular: Denies chest pain, chest tightness, palpitations or swelling in the hands or feet.  Gastrointestinal: Denies abdominal pain, bloating, constipation, diarrhea or blood in the stool.  GU: Denies urgency, frequency, pain with urination, burning sensation, blood in urine, odor or discharge. Musculoskeletal: Pt reports muscle fatigue. Denies decrease in range of motion, difficulty with gait, muscle or joint pain and swelling.  Skin: Denies redness, rashes, lesions or ulcercations.  Neurological: Denies dizziness, difficulty with memory, difficulty with speech or problems with balance and coordination.  Psych: Denies anxiety, depression, SI/HI.  No other specific complaints in a complete review of systems (except as listed in HPI above).  Objective:   Physical Exam        Assessment & Plan:

## 2019-03-08 ENCOUNTER — Ambulatory Visit (INDEPENDENT_AMBULATORY_CARE_PROVIDER_SITE_OTHER): Payer: Federal, State, Local not specified - PPO | Admitting: Psychology

## 2019-03-08 DIAGNOSIS — F411 Generalized anxiety disorder: Secondary | ICD-10-CM

## 2019-03-18 ENCOUNTER — Ambulatory Visit: Payer: Federal, State, Local not specified - PPO | Admitting: Psychology

## 2019-03-25 ENCOUNTER — Ambulatory Visit (INDEPENDENT_AMBULATORY_CARE_PROVIDER_SITE_OTHER): Payer: Federal, State, Local not specified - PPO | Admitting: Psychology

## 2019-03-25 DIAGNOSIS — F411 Generalized anxiety disorder: Secondary | ICD-10-CM

## 2019-04-01 ENCOUNTER — Ambulatory Visit (INDEPENDENT_AMBULATORY_CARE_PROVIDER_SITE_OTHER): Payer: Federal, State, Local not specified - PPO | Admitting: Psychology

## 2019-04-01 DIAGNOSIS — F411 Generalized anxiety disorder: Secondary | ICD-10-CM | POA: Diagnosis not present

## 2019-04-08 ENCOUNTER — Ambulatory Visit: Payer: Federal, State, Local not specified - PPO | Admitting: Psychology

## 2019-04-22 ENCOUNTER — Ambulatory Visit (INDEPENDENT_AMBULATORY_CARE_PROVIDER_SITE_OTHER): Payer: Federal, State, Local not specified - PPO | Admitting: Psychology

## 2019-04-22 DIAGNOSIS — F411 Generalized anxiety disorder: Secondary | ICD-10-CM

## 2019-05-06 ENCOUNTER — Ambulatory Visit: Payer: Federal, State, Local not specified - PPO | Admitting: Psychology

## 2019-05-13 ENCOUNTER — Ambulatory Visit (INDEPENDENT_AMBULATORY_CARE_PROVIDER_SITE_OTHER): Payer: Federal, State, Local not specified - PPO | Admitting: Psychology

## 2019-05-13 DIAGNOSIS — F411 Generalized anxiety disorder: Secondary | ICD-10-CM

## 2019-05-14 ENCOUNTER — Other Ambulatory Visit: Payer: Self-pay | Admitting: Family Medicine

## 2019-05-14 DIAGNOSIS — G43109 Migraine with aura, not intractable, without status migrainosus: Secondary | ICD-10-CM

## 2019-05-14 NOTE — Telephone Encounter (Signed)
Last filled on 01/07/2019 and LOV 01/07/2019 for CPE. Please review

## 2019-05-27 ENCOUNTER — Ambulatory Visit: Payer: Federal, State, Local not specified - PPO | Admitting: Psychology

## 2019-06-04 ENCOUNTER — Ambulatory Visit (INDEPENDENT_AMBULATORY_CARE_PROVIDER_SITE_OTHER): Payer: Federal, State, Local not specified - PPO | Admitting: Psychology

## 2019-06-04 ENCOUNTER — Other Ambulatory Visit: Payer: Self-pay

## 2019-06-04 DIAGNOSIS — F411 Generalized anxiety disorder: Secondary | ICD-10-CM

## 2019-06-04 MED ORDER — DULOXETINE HCL 20 MG PO CPEP: 20.0000 mg | ORAL_CAPSULE | Freq: Two times a day (BID) | ORAL | 2 refills | Status: DC

## 2019-06-04 NOTE — Telephone Encounter (Signed)
Pt left v/m requesting status of duloxetine refill to [facility]. [facility] said [facility] not received response from [facility] for duloxetine refill request. Pt is out of med since 06/03/19 in afternoon. Pt request refill ASAP; pt has been out of med before and caused dizziness. Pt request cb when refilled.

## 2019-06-04 NOTE — Telephone Encounter (Signed)
Spoke with patient and apologized for refill delay but do not see any requests from pharmacy so far unless it was a hard copy request and maybe went to Dr G since he was her previous PCP and filled it last. Please review. Thank you

## 2019-06-24 ENCOUNTER — Ambulatory Visit (INDEPENDENT_AMBULATORY_CARE_PROVIDER_SITE_OTHER): Payer: Federal, State, Local not specified - PPO | Admitting: Psychology

## 2019-06-24 DIAGNOSIS — F411 Generalized anxiety disorder: Secondary | ICD-10-CM | POA: Diagnosis not present

## 2019-07-18 ENCOUNTER — Ambulatory Visit (INDEPENDENT_AMBULATORY_CARE_PROVIDER_SITE_OTHER): Payer: Federal, State, Local not specified - PPO | Admitting: Psychology

## 2019-07-18 DIAGNOSIS — F411 Generalized anxiety disorder: Secondary | ICD-10-CM

## 2019-07-29 ENCOUNTER — Ambulatory Visit: Payer: Federal, State, Local not specified - PPO | Admitting: Psychology

## 2019-08-30 DIAGNOSIS — R002 Palpitations: Secondary | ICD-10-CM | POA: Diagnosis not present

## 2019-08-30 DIAGNOSIS — R5383 Other fatigue: Secondary | ICD-10-CM | POA: Diagnosis not present

## 2019-08-30 DIAGNOSIS — Z0000 Encounter for general adult medical examination without abnormal findings: Secondary | ICD-10-CM | POA: Diagnosis not present

## 2019-08-30 DIAGNOSIS — F419 Anxiety disorder, unspecified: Secondary | ICD-10-CM | POA: Diagnosis not present

## 2019-10-08 ENCOUNTER — Encounter: Payer: Self-pay | Admitting: Dermatology

## 2019-10-08 ENCOUNTER — Ambulatory Visit: Payer: Federal, State, Local not specified - PPO | Admitting: Dermatology

## 2019-10-08 ENCOUNTER — Other Ambulatory Visit: Payer: Self-pay

## 2019-10-08 VITALS — BP 113/69

## 2019-10-08 DIAGNOSIS — D485 Neoplasm of uncertain behavior of skin: Secondary | ICD-10-CM | POA: Diagnosis not present

## 2019-10-08 DIAGNOSIS — L700 Acne vulgaris: Secondary | ICD-10-CM | POA: Diagnosis not present

## 2019-10-08 DIAGNOSIS — L739 Follicular disorder, unspecified: Secondary | ICD-10-CM

## 2019-10-08 DIAGNOSIS — L918 Other hypertrophic disorders of the skin: Secondary | ICD-10-CM | POA: Diagnosis not present

## 2019-10-08 DIAGNOSIS — R238 Other skin changes: Secondary | ICD-10-CM | POA: Diagnosis not present

## 2019-10-08 MED ORDER — DOXYCYCLINE MONOHYDRATE 100 MG PO TABS: ORAL_TABLET | ORAL | 0 refills | Status: DC

## 2019-10-08 MED ORDER — TRIAMCINOLONE ACETONIDE 0.1 % EX LOTN: TOPICAL_LOTION | CUTANEOUS | 1 refills | Status: DC

## 2019-10-08 MED ORDER — MUPIROCIN 2 % EX OINT: 1.0000 "application " | TOPICAL_OINTMENT | Freq: Every day | CUTANEOUS | 0 refills | Status: DC

## 2019-10-08 NOTE — Progress Notes (Deleted)
   New Patient Visit  Subjective  Amanda Stout is a 44 y.o. female who presents for the following: No chief complaint on file..  ***  Objective  Well appearing patient in no apparent distress; mood and affect are within normal limits.  [id number] full examination was performed including scalp, head, eyes, ears, nose, lips, neck, chest, axillae, abdomen, back, buttocks, bilateral upper extremities, bilateral lower extremities, hands, feet, fingers, toes, fingernails, and toenails. All findings within normal limits unless otherwise noted below."}  No skin findings found.  Assessment & Plan   No follow-ups on file.

## 2019-10-08 NOTE — Patient Instructions (Addendum)
Doxycycline should be taken with food to prevent nausea. Do not lay down for 30 minutes after taking. Be cautious with sun exposure and use good sun protection while on this medication. Pregnant women should not take this medication.   Recommend daily broad spectrum sunscreen SPF 30+ to sun-exposed areas, reapply every 2 hours as needed. Call for new or changing lesions.  Spironolactone can cause increased urination and cause blood pressure to decrease. Please watch for signs of lightheadedness and be cautious when changing position. It can sometimes cause breast tenderness or an irregular period in premenopausal women. It can also increase potassium. The increase in potassium usually is not a concern unless you are taking other medicines that also increase potassium, so please be sure your doctor knows all of the other medications you are taking. This medication should not be taken  by pregnant women.  Topical steroids (such as triamcinolone, fluocinolone, fluocinonide, mometasone, clobetasol, halobetasol, betamethasone, hydrocortisone) can cause thinning and lightening of the skin if they are used for too long in the same area. Your physician has selected the right strength medicine for your problem and area affected on the body. Please use your medication only as directed by your physician to prevent side effects.   Shave Excision Benign Lesion Wound Care Instructions  . Leave the original bandage on for 24 hours if possible.  If the bandage becomes soaked or soiled before that time, it is OK to remove it and examine the wound.  A small amount of post-operative bleeding is normal.  If excessive bleeding occurs, remove the bandage, place gauze over the site and apply continuous pressure (no peeking) over the area for 20-30 minutes.  If this does not stop the bleeding, try again for 40 minutes.  If this does not work, please call our clinic as soon as possible (even if after-hours).    . Twice a day,  cleanse the wound with soap and water.  If a thick crust develops you may use a Q-tip dipped into dilute hydrogen peroxide (mix 1:1 with water) to dissolve it.  Hydrogen peroxide can slow the healing process, so use it only as needed.  After washing, apply Vaseline jelly or Polysporin ointment.  For best healing, the wound should be covered with a layer of ointment at all times.  This may mean re-applying the ointment several times a day.  For open wounds, continue until it has healed.    . If you have any swelling, keep the area elevated.  . Some redness, tenderness and white or yellow material in the wound is normal healing.  If the area becomes very sore and red, or develops a thick yellow-green material (pus), it may be infected; please notify us.    . Wound healing continues for up to one year following surgery.  It is not unusual to experience pain in the scar from time to time during the interval.  If the pain becomes severe or the scar thickens, you should notify the office.  A slight amount of redness in a scar is expected for the first six months.  After six months, the redness subsides and the scar will soften and fade.  The color difference becomes less noticeable with time.  If there are any problems, return for a post-op surgery check at your earliest convenience.  . Please call our office for any questions or concerns.

## 2019-10-08 NOTE — Progress Notes (Signed)
Follow-Up Visit   Subjective  Amanda Stout is a 44 y.o. female who presents for the following: Skin Problem (Hx staph infection about 3 years ago and was treated in our office with oral pill. Itching, bumps and pain at back of neck, patient concerned it could be related to previous staph infection. ).  Scalp is itchy and has been for years. It has never been treated. Also has a skin tag at panty line, has grown in size. Gets irritated with shaving. Patient does have acne and advises it is worse with periods and cystic.   The following portions of the chart were reviewed this encounter and updated as appropriate: Tobacco  Allergies  Meds  Problems  Med Hx  Surg Hx  Fam Hx      Review of Systems: No other skin or systemic complaints.  Objective  Well appearing patient in no apparent distress; mood and affect are within normal limits.  A focused examination was performed including scalp, face, neck, back, chest, abdomen, groin. Relevant physical exam findings are noted in the Assessment and Plan.  Objective  face: Scattered inflammatory papules, rare resolving cyst at jaw and cheeks  Objective  Neck, trunk: Follicular-based erythematous papules and pustules.   Objective  Left Medial Thigh: 0.4cm erythematous pedunculated papule  Assessment & Plan  Acne vulgaris face  Chronic, flared  Pending normal potassium plan spironolactone 50mg  qhs. Will repeat potassium level 2 weeks after starting since she is on metoprolol.  BP 113/69  Spironolactone can cause increased urination and cause blood pressure to decrease. Please watch for signs of lightheadedness and be cautious when changing position. It can sometimes cause breast tenderness or an irregular period in premenopausal women. It can also increase potassium. The increase in potassium usually is not a concern unless you are taking other medicines that also increase potassium, so please be sure your doctor knows all of  the other medications you are taking. This medication should not be taken  by pregnant women.   Other Related Procedures Potassium Potassium  Folliculitis Neck, trunk  Chronic, flared  Start doxycycline mono 100mg  1 PO BID with food x 1 week #14 0RF Start Cln Sport wash, leave on for 2-3 minutes before rinsing OR can use puracyn spray and leave on Recommend Head & Shoulders shampoo or Cln shampoo for scalp.   Start TMC 0.1% lotion to AA's PRN itch. Avoid applying to face, groin, and axilla. Use as directed. Risk of skin atrophy with long-term use reviewed.   Doxycycline should be taken with food to prevent nausea. Do not lay down for 30 minutes after taking. Be cautious with sun exposure and use good sun protection while on this medication. Pregnant women should not take this medication.   Topical steroids (such as triamcinolone, fluocinolone, fluocinonide, mometasone, clobetasol, halobetasol, betamethasone, hydrocortisone) can cause thinning and lightening of the skin if they are used for too long in the same area. Your physician has selected the right strength medicine for your problem and area affected on the body. Please use your medication only as directed by your physician to prevent side effects.    doxycycline (ADOXA) 100 MG tablet - Neck, trunk  triamcinolone lotion (KENALOG) 0.1 % - Neck, trunk  Neoplasm of uncertain behavior of skin Left Medial Thigh  Epidermal / dermal shaving  Lesion length (cm):  0.4 Lesion width (cm):  0.4 Margin per side (cm):  0.1 Total excision diameter (cm):  0.6 Informed consent: discussed and consent obtained  Anesthesia: the lesion was anesthetized in a standard fashion   Anesthetic:  1% lidocaine w/ epinephrine 1-100,000 buffered w/ 8.4% NaHCO3 Instrument used: scissors   Hemostasis achieved with: pressure and aluminum chloride   Outcome: patient tolerated procedure well   Post-procedure details: wound care instructions given     mupirocin ointment (BACTROBAN) 2 %  Specimen 1 - Surgical pathology Differential Diagnosis: r/o Irritated Acrochordon vs Nevus vs Other Check Margins: No 0.4cm erythematous pedunculated papule  Start mupirocin ointment daily with bandage changes.  Return in about 1 month (around 11/07/2019).   Graciella Belton, RMA, am acting as scribe for Forest Gleason, MD .  Documentation: I have reviewed the above documentation for accuracy and completeness, and I agree with the above.  Forest Gleason, MD

## 2019-10-09 NOTE — Progress Notes (Signed)
Skin , left medial thigh ACROCHORDON, INFLAMED  Skin tag, benign, no additional treatment needed.

## 2019-10-14 ENCOUNTER — Telehealth: Payer: Self-pay

## 2019-10-14 NOTE — Telephone Encounter (Signed)
Left message to advise patient of pathology results.

## 2019-10-17 DIAGNOSIS — G43709 Chronic migraine without aura, not intractable, without status migrainosus: Secondary | ICD-10-CM | POA: Diagnosis not present

## 2019-10-17 DIAGNOSIS — F419 Anxiety disorder, unspecified: Secondary | ICD-10-CM | POA: Diagnosis not present

## 2020-01-05 IMAGING — CR DG CHEST 2V
1 series · 3 of 3 positions shown · non-contrast
Comparison: None.

CLINICAL DATA: Palpitations and shortness of breath.

EXAM:
CHEST - 2 VIEW

[Series 1: dg chest 2 view · 0.14mm/px · 3 of 3 slices shown]
[im 1/3]
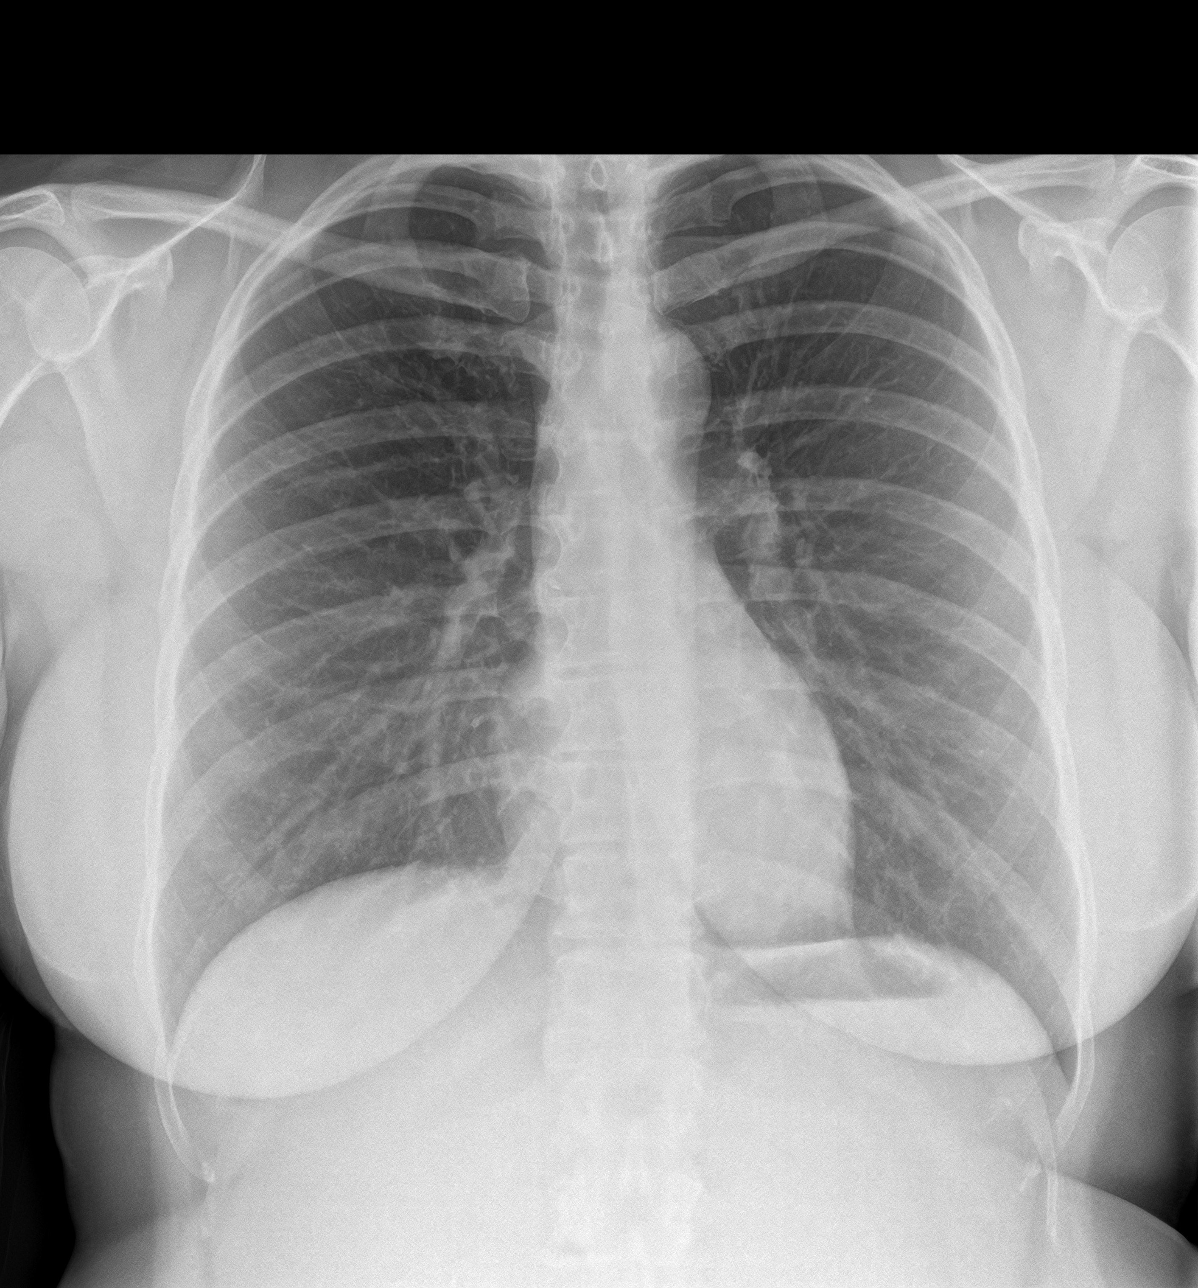
[im 2/3]
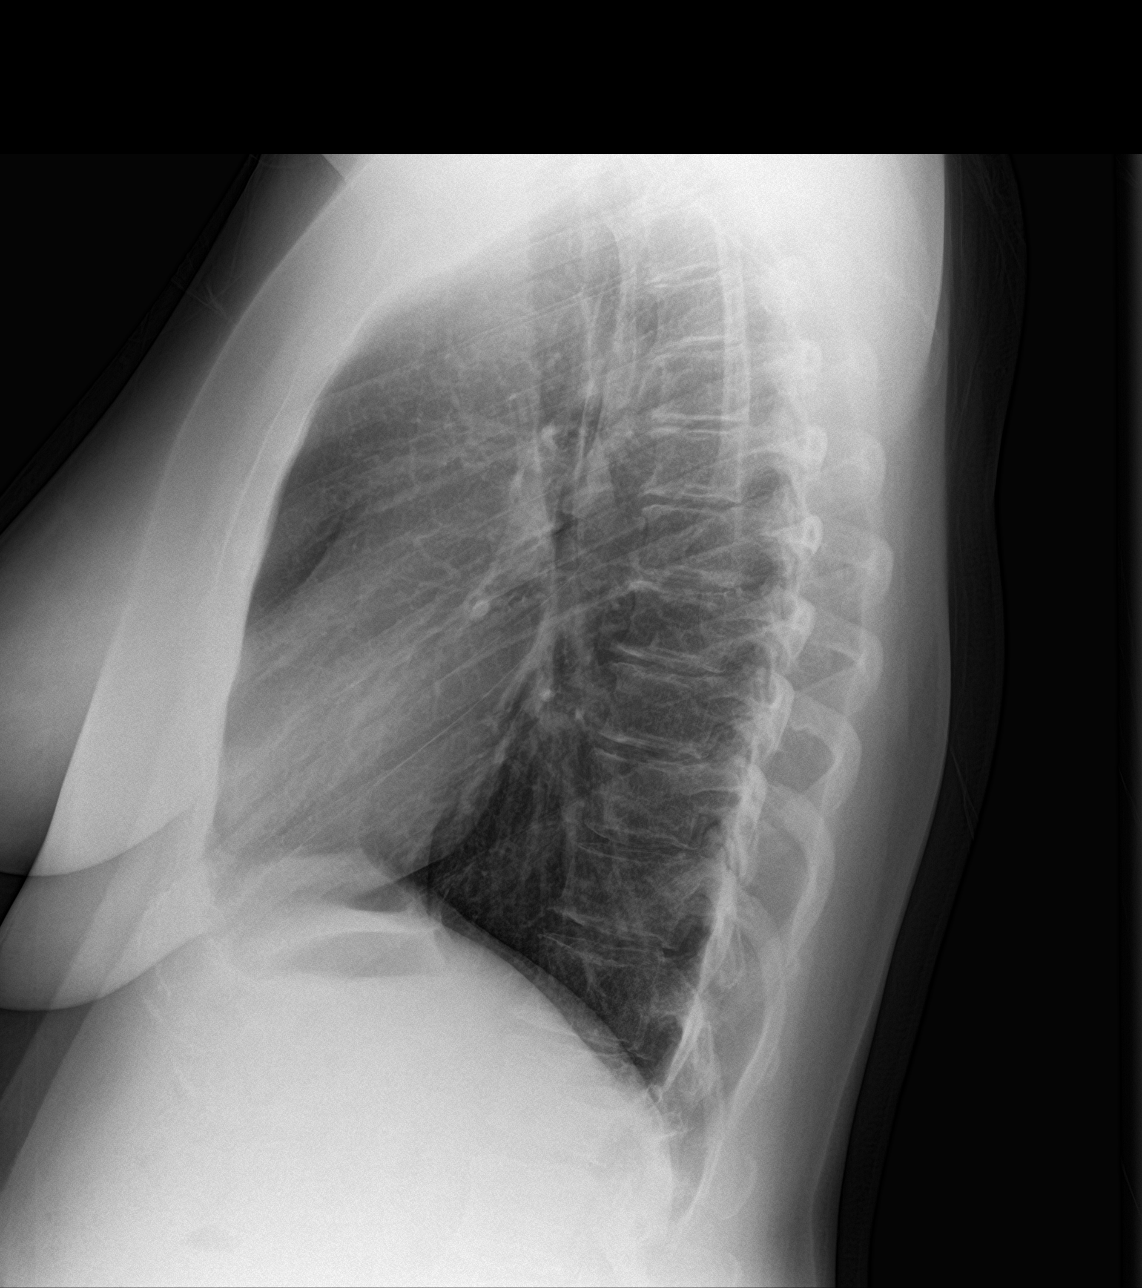
[im 3/3]
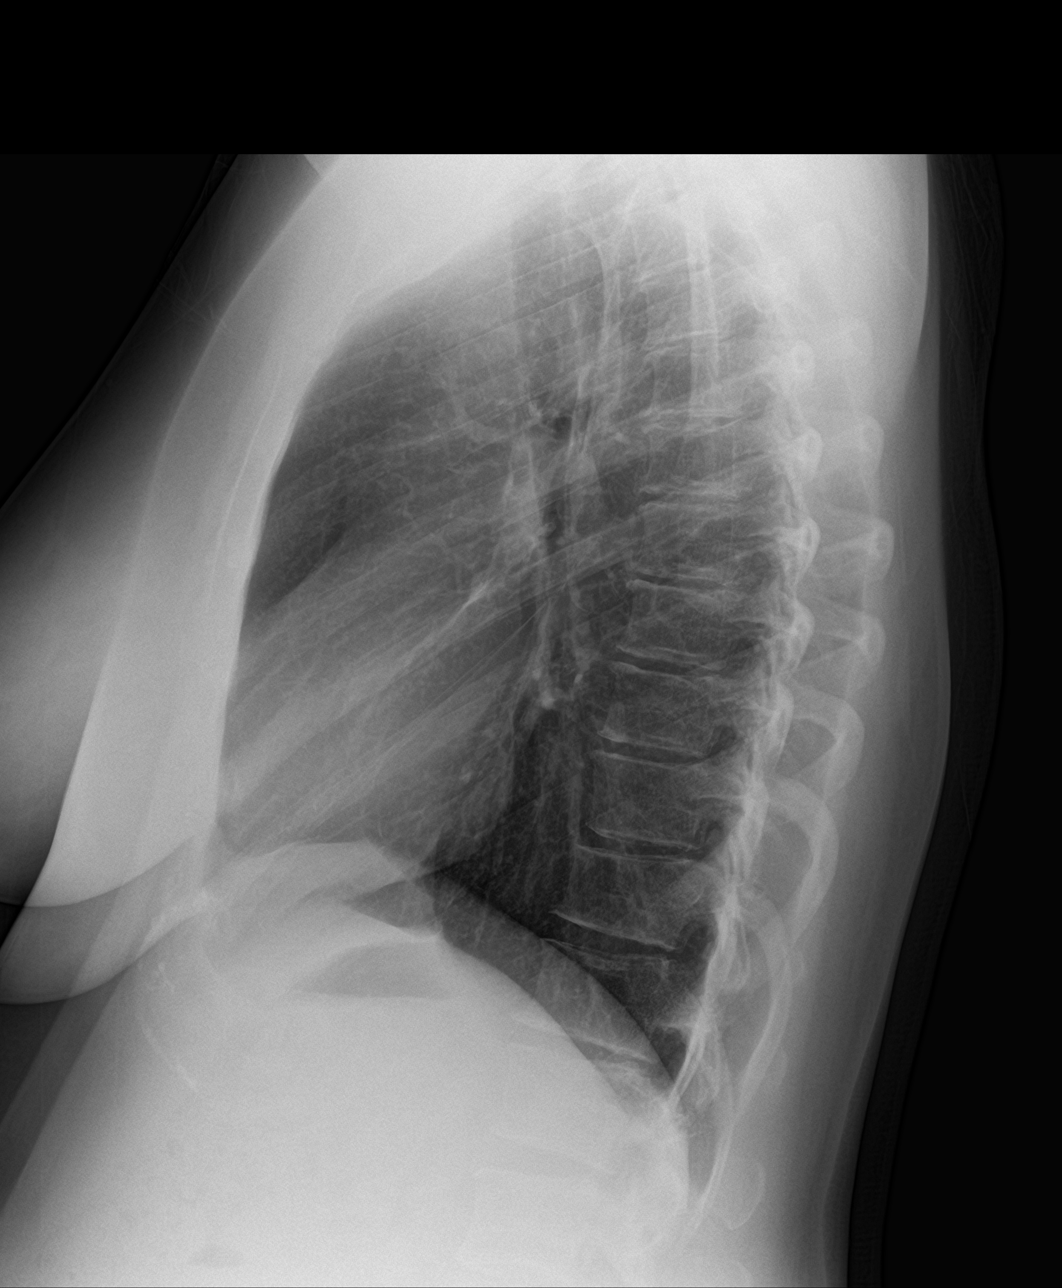

[3 of 3 positions shown; findings below may reference images not displayed]

FINDINGS: Normal sized heart. Clear lungs with normal vascularity. Mild
thoracic spine degenerative changes.
IMPRESSION: No acute abnormality.

## 2020-01-11 ENCOUNTER — Other Ambulatory Visit: Payer: Self-pay | Admitting: Family Medicine

## 2020-01-11 DIAGNOSIS — G43109 Migraine with aura, not intractable, without status migrainosus: Secondary | ICD-10-CM

## 2020-10-15 ENCOUNTER — Other Ambulatory Visit: Payer: Self-pay | Admitting: Family Medicine

## 2020-10-15 DIAGNOSIS — G43109 Migraine with aura, not intractable, without status migrainosus: Secondary | ICD-10-CM

## 2020-10-28 ENCOUNTER — Ambulatory Visit (INDEPENDENT_AMBULATORY_CARE_PROVIDER_SITE_OTHER): Payer: Federal, State, Local not specified - PPO | Admitting: Psychology

## 2020-10-28 DIAGNOSIS — F411 Generalized anxiety disorder: Secondary | ICD-10-CM | POA: Diagnosis not present

## 2020-11-12 ENCOUNTER — Ambulatory Visit (INDEPENDENT_AMBULATORY_CARE_PROVIDER_SITE_OTHER): Payer: Federal, State, Local not specified - PPO | Admitting: Psychology

## 2020-11-12 DIAGNOSIS — F411 Generalized anxiety disorder: Secondary | ICD-10-CM

## 2020-11-26 ENCOUNTER — Ambulatory Visit (INDEPENDENT_AMBULATORY_CARE_PROVIDER_SITE_OTHER): Payer: Federal, State, Local not specified - PPO | Admitting: Psychology

## 2020-11-26 DIAGNOSIS — F411 Generalized anxiety disorder: Secondary | ICD-10-CM | POA: Diagnosis not present

## 2020-12-17 ENCOUNTER — Ambulatory Visit (INDEPENDENT_AMBULATORY_CARE_PROVIDER_SITE_OTHER): Payer: Federal, State, Local not specified - PPO | Admitting: Psychology

## 2020-12-17 DIAGNOSIS — F411 Generalized anxiety disorder: Secondary | ICD-10-CM

## 2020-12-31 ENCOUNTER — Ambulatory Visit: Payer: Federal, State, Local not specified - PPO | Admitting: Psychology

## 2021-01-14 ENCOUNTER — Ambulatory Visit: Payer: Federal, State, Local not specified - PPO | Admitting: Psychology

## 2021-01-28 ENCOUNTER — Ambulatory Visit (INDEPENDENT_AMBULATORY_CARE_PROVIDER_SITE_OTHER): Payer: Federal, State, Local not specified - PPO | Admitting: Psychology

## 2021-01-28 DIAGNOSIS — F411 Generalized anxiety disorder: Secondary | ICD-10-CM

## 2021-02-11 ENCOUNTER — Ambulatory Visit: Payer: Federal, State, Local not specified - PPO | Admitting: Psychology

## 2021-02-17 ENCOUNTER — Ambulatory Visit: Payer: Federal, State, Local not specified - PPO | Admitting: Psychology

## 2021-02-26 ENCOUNTER — Ambulatory Visit (INDEPENDENT_AMBULATORY_CARE_PROVIDER_SITE_OTHER): Payer: Federal, State, Local not specified - PPO | Admitting: Psychology

## 2021-02-26 DIAGNOSIS — F411 Generalized anxiety disorder: Secondary | ICD-10-CM | POA: Diagnosis not present

## 2021-04-25 ENCOUNTER — Encounter: Payer: Self-pay | Admitting: Emergency Medicine

## 2021-04-25 ENCOUNTER — Other Ambulatory Visit: Payer: Self-pay

## 2021-04-25 ENCOUNTER — Emergency Department
Admission: EM | Admit: 2021-04-25 | Discharge: 2021-04-25 | Disposition: A | Discharge status: Home / Self Care | Payer: Federal, State, Local not specified - PPO | Attending: Emergency Medicine | Admitting: Emergency Medicine

## 2021-04-25 ENCOUNTER — Emergency Department: Payer: Federal, State, Local not specified - PPO

## 2021-04-25 DIAGNOSIS — R0602 Shortness of breath: Secondary | ICD-10-CM | POA: Insufficient documentation

## 2021-04-25 DIAGNOSIS — Z8049 Family history of malignant neoplasm of other genital organs: Secondary | ICD-10-CM | POA: Insufficient documentation

## 2021-04-25 DIAGNOSIS — R0789 Other chest pain: Secondary | ICD-10-CM | POA: Insufficient documentation

## 2021-04-25 DIAGNOSIS — Z6832 Body mass index (BMI) 32.0-32.9, adult: Secondary | ICD-10-CM | POA: Diagnosis not present

## 2021-04-25 LAB — CBC WITH DIFFERENTIAL/PLATELET
Abs Immature Granulocytes: 0.02 10*3/uL (ref 0.00–0.07)
Basophils Absolute: 0.1 10*3/uL (ref 0.0–0.1)
Basophils Relative: 1 %
Eosinophils Absolute: 0.2 10*3/uL (ref 0.0–0.5)
Eosinophils Relative: 4 %
HCT: 40.3 % (ref 36.0–46.0)
Hemoglobin: 13.6 g/dL (ref 12.0–15.0)
Immature Granulocytes: 0 %
Lymphocytes Relative: 33 %
Lymphs Abs: 1.8 10*3/uL (ref 0.7–4.0)
MCH: 30 pg (ref 26.0–34.0)
MCHC: 33.7 g/dL (ref 30.0–36.0)
MCV: 88.8 fL (ref 80.0–100.0)
Monocytes Absolute: 0.5 10*3/uL (ref 0.1–1.0)
Monocytes Relative: 9 %
Neutro Abs: 2.9 10*3/uL (ref 1.7–7.7)
Neutrophils Relative %: 53 %
Platelets: 269 10*3/uL (ref 150–400)
RBC: 4.54 MIL/uL (ref 3.87–5.11)
RDW: 11.9 % (ref 11.5–15.5)
WBC: 5.4 10*3/uL (ref 4.0–10.5)
nRBC: 0 % (ref 0.0–0.2)

## 2021-04-25 LAB — COMPREHENSIVE METABOLIC PANEL
ALT: 15 U/L (ref 0–44)
AST: 16 U/L (ref 15–41)
Albumin: 3.9 g/dL (ref 3.5–5.0)
Alkaline Phosphatase: 73 U/L (ref 38–126)
Anion gap: 11 (ref 5–15)
BUN: 11 mg/dL (ref 6–20)
CO2: 25 mmol/L (ref 22–32)
Calcium: 9.2 mg/dL (ref 8.9–10.3)
Chloride: 101 mmol/L (ref 98–111)
Creatinine, Ser: 0.79 mg/dL (ref 0.44–1.00)
GFR, Estimated: >60 mL/min (ref >60)
Glucose, Bld: 90 mg/dL (ref 70–99)
Potassium: 3.9 mmol/L (ref 3.5–5.1)
Sodium: 137 mmol/L (ref 135–145)
Total Bilirubin: 0.6 mg/dL (ref 0.3–1.2)
Total Protein: 7.5 g/dL (ref 6.5–8.1)

## 2021-04-25 LAB — TROPONIN I (HIGH SENSITIVITY): Troponin I (High Sensitivity): <2 ng/L (ref <18)

## 2021-04-25 LAB — D-DIMER, QUANTITATIVE: D-Dimer, Quant: 0.48 ug/mL-FEU (ref 0.00–0.50)

## 2021-04-25 NOTE — ED Provider Notes (Signed)
[facility] Emergency Department Provider Note   ____________________________________________   Event Date/Time   First MD Initiated Contact with Patient 04/25/21 [id number]     (approximate)  I have reviewed the triage vital signs and the nursing notes.   HISTORY  Chief Complaint Chest Pain and Shortness of Breath    HPI Amanda Stout is a 45 y.o. female patient reports she got very sick last year with pneumonia.  Since then she has had intermittent episodes of chest achiness in the middle of her chest.  Usually they last 2 to 3 days and go away.  They are not accompanied with fever.  The pain is not pleuritic.  Is not made worse with exercise.  It does not get better with rest and it is not radiating.  She came in today because the pain has lasted 6 days now and really is not any better. Patient also complains of about 4 years of intermittent episodes of tearing pain in her lower abdomen which has been present since she delivered her child.  She has not been back to see OB/GYN if she had a bad experience.      Past Medical History:  Diagnosis Date   ACL injury tear 04/2015   right   Arthritis    L4-5   Chronic migraine    Exposure to [place] virus 09/29/2016   Zika NAA urine serum done   Lower back pain 2010   s/p herniated disc   Migraine with aura 05/2017   since 2018 pregnancy   Perennial allergic rhinitis     Patient Active Problem List   Diagnosis Date Noted   Left otitis media 07/03/2017   Jaw pain 07/03/2017   Right otitis media 06/16/2017   Numbness and tingling in right hand 02/22/2017   Depression, postpartum 02/22/2017   Postpartum care following vaginal delivery 01/29/2017   Fetal arrhythmia before the onset of labor 01/02/2017   Dizziness 11/01/2016   Exposure to [place] virus 09/29/2016   Supervision of high risk pregnancy, antepartum, third trimester 09/26/2016   BMI 32.0-32.9,adult 09/26/2016   Group beta Strep positive 07/11/2016    Rubella non-immune status, antepartum 07/08/2016   Anxiety attack 03/09/2016   Myalgia 01/21/2016   Acute sinusitis 05/01/2015   Complete tear of right ACL 04/10/2015   Perennial allergic rhinitis    Migraine with typical aura    MDD (major depressive disorder), recurrent episode, moderate (HCC)    Lower back pain    Lactose intolerance in adult     Past Surgical History:  Procedure Laterality Date   KNEE ARTHROSCOPY WITH ANTERIOR CRUCIATE LIGAMENT (ACL) REPAIR Right 04/10/2015   Marchia Bond, MD;  [place]    Prior to Admission medications   Medication Sig Start Date End Date Taking? Authorizing Provider  aspirin-acetaminophen-caffeine (EXCEDRIN MIGRAINE) [phone] MG tablet Take by mouth every 6 (six) hours as needed for headache.    [provider]  doxycycline (ADOXA) 100 MG tablet 1 by mouth twice a day with food 10/08/19   Moye, [place], MD  DULoxetine (CYMBALTA) 20 MG capsule Take 1 capsule (20 mg total) by mouth 2 (two) times daily. Patient not taking: Reported on 10/08/2019 06/04/19   Lesleigh Noe, MD  ibuprofen (ADVIL) 200 MG tablet Take 200 mg by mouth every 6 (six) hours as needed.    [provider]  Loratadine-Pseudoephedrine (CLARITIN-D 24 HOUR PO) Take by mouth.  [provider]  metoprolol tartrate (LOPRESSOR) 25 MG tablet Take 1 tablet (25 mg total) by mouth 2 (two) times daily as needed (for palpitations). 05/10/18 05/10/19  Lavonia Drafts, MD  mupirocin ointment (BACTROBAN) 2 % Apply 1 application topically daily. With bandage change 10/08/19   Moye, [place], MD  norethindrone (MICRONOR) 0.35 MG tablet Take 1 tablet (0.35 mg total) by mouth daily. 01/07/19   Lesleigh Noe, MD  SUMAtriptan (IMITREX) 50 MG tablet TAKE 0.5-1 TABLET BY MOUTH ONCE FOR 1 DOSE. MAY REPEAT IN 2 HOURS IF HEADACHE PERSISTS OR RECURS. 05/14/19   Lesleigh Noe, MD  triamcinolone lotion (KENALOG) 0.1 % Apply to  affected areas 1-2 times daily as needed for itch. Avoid face, groin, underarms. 10/08/19   Moye, [place], MD    Allergies Lactose intolerance (gi)  Family History  Problem Relation Age of Onset   Cancer Mother 14       uterine   Hyperlipidemia Father    Hypertension Father    Arrhythmia Father    Heart attack Father 10   Stroke Maternal Grandmother    CAD Maternal Grandfather 53       MI   Hypertension Maternal Grandfather    CAD Paternal Grandfather 41       MI   Hypertension Paternal Grandfather     Social History Social History   Tobacco Use   Smoking status: Never   Smokeless tobacco: Never  Vaping Use   Vaping Use: Never used  Substance Use Topics   Alcohol use: Yes    Comment: 3 x week, 1 serving of alcohol   Drug use: No    Review of Systems  Constitutional: No fever/chills Eyes: No visual changes. ENT: No sore throat. Cardiovascular:  chest pain. Respiratory: Denies shortness of breath. Gastrointestinal: abdominal pain.  No nausea, no vomiting.  No diarrhea.  No constipation. Genitourinary: Negative for dysuria. Musculoskeletal: Negative for back pain. Skin: Negative for rash. Neurological: Negative for headaches, focal weakness   ____________________________________________   PHYSICAL EXAM:  VITAL SIGNS: ED Triage Vitals  Enc Vitals Group     BP 04/25/21 0921 131/89     Pulse Rate 04/25/21 0921 83     Resp 04/25/21 0921 20     Temp 04/25/21 0921 98.1 F (36.7 C)     Temp Source 04/25/21 0921 Oral     SpO2 04/25/21 0921 99 %     Weight 04/25/21 0922 210 lb (95.3 kg)     Height 04/25/21 0922 5\' 6"  (1.676 m)     Head Circumference --      Peak Flow --      Pain Score 04/25/21 0921 4     Pain Loc --      Pain Edu? --      Excl. in [place]? --     Constitutional: Alert and oriented. Well appearing and in no acute distress. Eyes: Conjunctivae are normal. PER Head: Atraumatic. Nose: No congestion/rhinnorhea. Mouth/Throat: Mucous membranes  are moist.  Oropharynx non-erythematous. Neck: No stridor.  Cardiovascular: Normal rate, regular rhythm. Grossly normal heart sounds.  Good peripheral circulation. Respiratory: Normal respiratory effort.  No retractions. Lungs CTAB. Gastrointestinal: Soft and nontender. No distention. No abdominal bruits. No CVA tenderness.  No palpable hernia in the low abdomen. Musculoskeletal: No lower extremity tenderness nor edema.  . Neurologic:  Normal speech and language. No gross focal neurologic deficits are appreciated. No gait instability. Skin:  Skin is warm, dry and intact. No rash noted.  ____________________________________________   LABS (all labs ordered are listed, but only abnormal results are displayed)  Labs Reviewed  COMPREHENSIVE METABOLIC PANEL  CBC WITH DIFFERENTIAL/PLATELET  D-DIMER, QUANTITATIVE  POC URINE PREG, ED  TROPONIN I (HIGH SENSITIVITY)   ____________________________________________  EKG  EKG read interpreted by me shows normal sinus rhythm at 86 normal axis no acute ST-T wave changes ____________________________________________  RADIOLOGY Gertha Calkin, personally viewed and evaluated these images (plain radiographs) as part of my medical decision making, as well as reviewing the written report by the radiologist.  ED MD interpretation: Chest x-ray read by radiology reviewed by me is negative  Official radiology report(s): DG Chest 2 View  Result Date: 04/25/2021 CLINICAL DATA:  cp EXAM: CHEST - 2 VIEW COMPARISON:  May 10, 2018 FINDINGS: The cardiomediastinal silhouette is normal in contour. No pleural effusion. No pneumothorax. No acute pleuroparenchymal abnormality. Visualized abdomen is unremarkable. No acute osseous abnormality noted. IMPRESSION: No acute cardiopulmonary abnormality. Electronically Signed   By: Valentino Saxon M.D.   On: 04/25/2021 09:51    ____________________________________________   PROCEDURES  Procedure(s)  performed (including Critical Care):  Procedures   ____________________________________________   INITIAL IMPRESSION / ASSESSMENT AND PLAN / ED COURSE I have advised the patient to make sure she follows up with OB/GYN.  If she does not like her doctor she can try another 1 at the same clinic or alternatively there are several other clinics in the area with very good OB/GYN doctors.  She has an appointment with her internal medicine doctor in the next few days.  I will have her keep that.  The chest pain has been chronic and intermittent.  It does not present as cardiac pain and her troponin is completely negative after 6 days of pain.  EKG and chest x-ray are normal.  D-dimer is negative as well.  This could be some intermittent pleurisy or something similar although the pain does not really sound pleuritic.  I will have her follow-up with her internist as planned and he can refer to cardiology if he feels it necessary.         Clinical Course as of 04/25/21 1124  Sun Apr 25, 2021  0934 CBC [PM]    Clinical Course User Index [PM] Nena Polio, MD     ____________________________________________   FINAL CLINICAL IMPRESSION(S) / ED DIAGNOSES  Final diagnoses:  Atypical chest pain     ED Discharge Orders     None        Note:  This document was prepared using Dragon voice recognition software and may include unintentional dictation errors.    Nena Polio, MD 04/25/21 1128

## 2021-04-25 NOTE — ED Triage Notes (Signed)
Pt reports got real sick last year and since then she has had intermittent chest pressure, SOB and dizzy spells. Pt reports the episodes have became more frequent and this episode started at 0600 this am and has lasted since then. Pt reports the pain is pressure like

## 2021-04-25 NOTE — Discharge Instructions (Signed)
Your heart blood work today was normal as was your EKG and chest x-ray.  Please follow-up with your internal medicine doctor as you planned.  I would also advise you to follow-up with OB/GYN.  Return as needed.

## 2021-10-07 DIAGNOSIS — U071 COVID-19: Secondary | ICD-10-CM | POA: Diagnosis not present

## 2021-11-28 DIAGNOSIS — H1033 Unspecified acute conjunctivitis, bilateral: Secondary | ICD-10-CM | POA: Diagnosis not present

## 2021-12-10 DIAGNOSIS — J0100 Acute maxillary sinusitis, unspecified: Secondary | ICD-10-CM | POA: Diagnosis not present

## 2022-04-04 DIAGNOSIS — M25461 Effusion, right knee: Secondary | ICD-10-CM | POA: Diagnosis not present

## 2022-04-12 DIAGNOSIS — M25561 Pain in right knee: Secondary | ICD-10-CM | POA: Diagnosis not present

## 2022-04-18 DIAGNOSIS — M25561 Pain in right knee: Secondary | ICD-10-CM | POA: Diagnosis not present

## 2022-04-20 DIAGNOSIS — F411 Generalized anxiety disorder: Secondary | ICD-10-CM | POA: Diagnosis not present

## 2022-05-04 DIAGNOSIS — S83231A Complex tear of medial meniscus, current injury, right knee, initial encounter: Secondary | ICD-10-CM | POA: Diagnosis not present

## 2022-05-04 DIAGNOSIS — S83241A Other tear of medial meniscus, current injury, right knee, initial encounter: Secondary | ICD-10-CM | POA: Diagnosis not present

## 2022-05-04 DIAGNOSIS — S83271A Complex tear of lateral meniscus, current injury, right knee, initial encounter: Secondary | ICD-10-CM | POA: Diagnosis not present

## 2022-05-04 DIAGNOSIS — S83281A Other tear of lateral meniscus, current injury, right knee, initial encounter: Secondary | ICD-10-CM | POA: Diagnosis not present

## 2022-05-04 DIAGNOSIS — M1711 Unilateral primary osteoarthritis, right knee: Secondary | ICD-10-CM | POA: Diagnosis not present

## 2022-05-04 DIAGNOSIS — G8918 Other acute postprocedural pain: Secondary | ICD-10-CM | POA: Diagnosis not present

## 2022-06-12 DIAGNOSIS — R059 Cough, unspecified: Secondary | ICD-10-CM | POA: Diagnosis not present

## 2022-06-12 DIAGNOSIS — J209 Acute bronchitis, unspecified: Secondary | ICD-10-CM | POA: Diagnosis not present

## 2022-07-05 DIAGNOSIS — F411 Generalized anxiety disorder: Secondary | ICD-10-CM | POA: Diagnosis not present

## 2022-07-11 ENCOUNTER — Other Ambulatory Visit: Payer: Self-pay | Admitting: Family Medicine

## 2022-07-11 DIAGNOSIS — Z1231 Encounter for screening mammogram for malignant neoplasm of breast: Secondary | ICD-10-CM

## 2022-07-11 DIAGNOSIS — F419 Anxiety disorder, unspecified: Secondary | ICD-10-CM | POA: Diagnosis not present

## 2022-07-11 DIAGNOSIS — R079 Chest pain, unspecified: Secondary | ICD-10-CM | POA: Diagnosis not present

## 2022-07-11 DIAGNOSIS — N644 Mastodynia: Secondary | ICD-10-CM | POA: Diagnosis not present

## 2022-07-11 DIAGNOSIS — E669 Obesity, unspecified: Secondary | ICD-10-CM | POA: Diagnosis not present

## 2022-07-28 ENCOUNTER — Ambulatory Visit
Admission: RE | Admit: 2022-07-28 | Discharge: 2022-07-28 | Disposition: A | Discharge status: Home / Self Care | Payer: Federal, State, Local not specified - PPO | Source: Ambulatory Visit | Attending: Family Medicine | Admitting: Family Medicine

## 2022-07-28 DIAGNOSIS — Z1231 Encounter for screening mammogram for malignant neoplasm of breast: Secondary | ICD-10-CM

## 2022-08-09 DIAGNOSIS — F411 Generalized anxiety disorder: Secondary | ICD-10-CM | POA: Diagnosis not present

## 2022-09-01 DIAGNOSIS — G43111 Migraine with aura, intractable, with status migrainosus: Secondary | ICD-10-CM | POA: Diagnosis not present

## 2022-09-01 DIAGNOSIS — F419 Anxiety disorder, unspecified: Secondary | ICD-10-CM | POA: Diagnosis not present

## 2022-09-01 DIAGNOSIS — Z3009 Encounter for other general counseling and advice on contraception: Secondary | ICD-10-CM | POA: Diagnosis not present

## 2022-09-01 DIAGNOSIS — E669 Obesity, unspecified: Secondary | ICD-10-CM | POA: Diagnosis not present

## 2022-09-28 DIAGNOSIS — F411 Generalized anxiety disorder: Secondary | ICD-10-CM | POA: Diagnosis not present

## 2022-12-18 DIAGNOSIS — J020 Streptococcal pharyngitis: Secondary | ICD-10-CM | POA: Diagnosis not present

## 2022-12-18 DIAGNOSIS — Z20818 Contact with and (suspected) exposure to other bacterial communicable diseases: Secondary | ICD-10-CM | POA: Diagnosis not present

## 2022-12-21 IMAGING — CR DG CHEST 2V
2 series · 2 of 2 positions shown · non-contrast
Comparison: May 10, 2018

CLINICAL DATA: cp

EXAM:
CHEST - 2 VIEW

[chest pa]
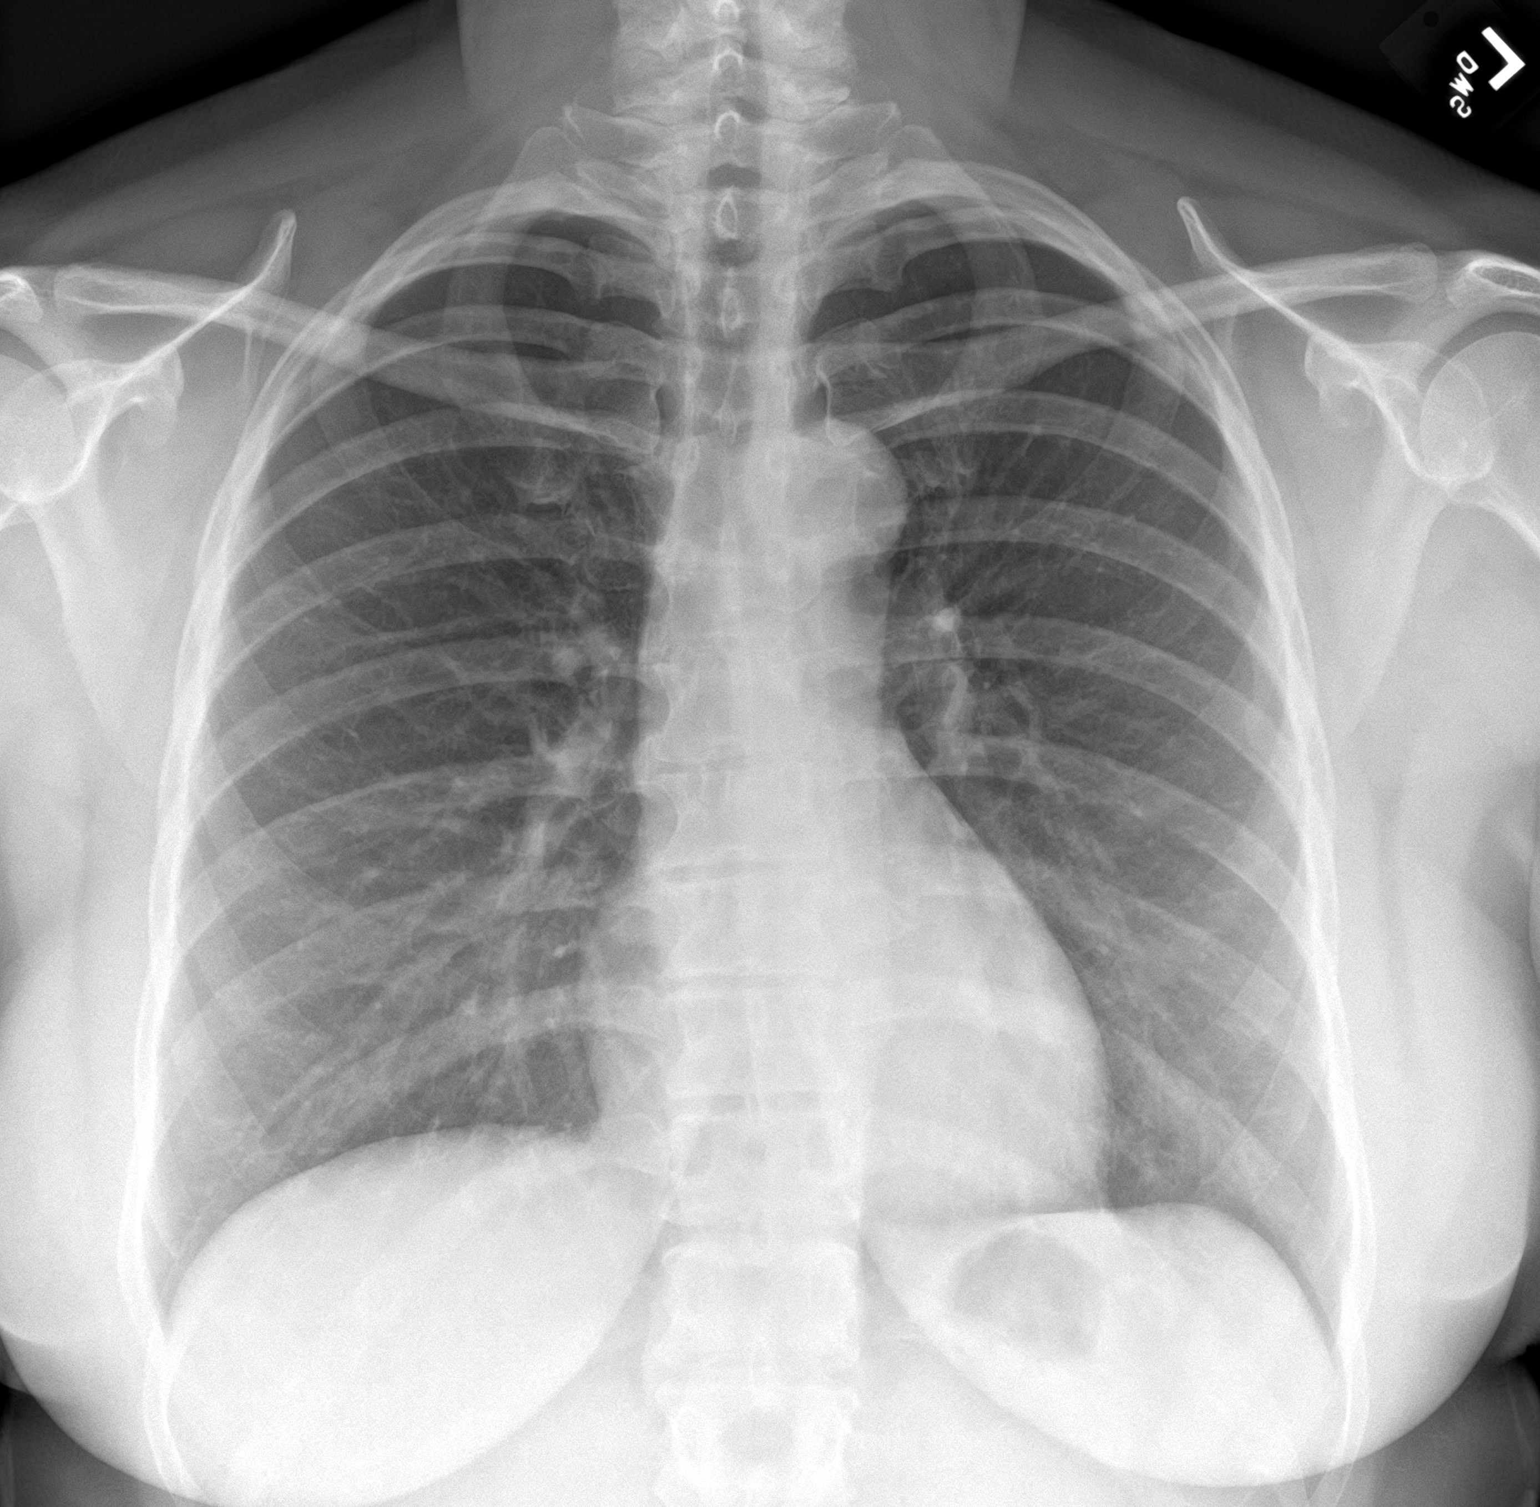

[chest lat]
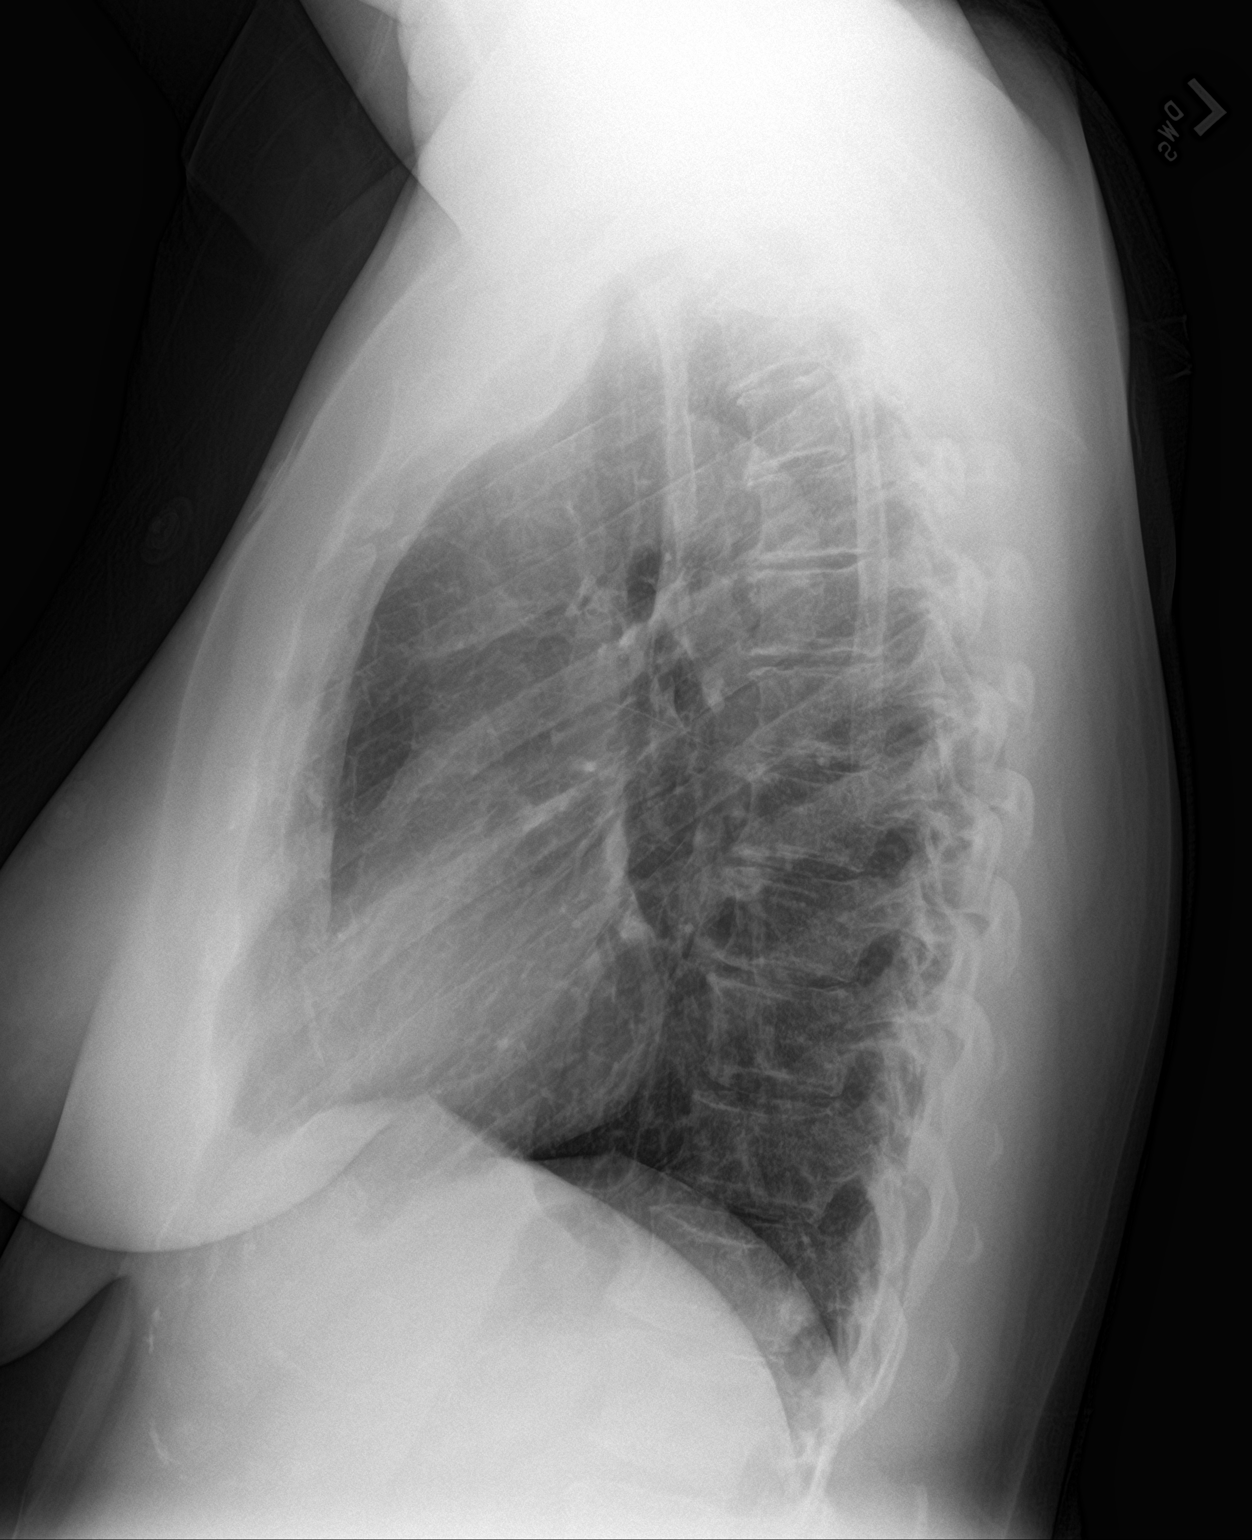

[2 of 2 positions shown; findings below may reference images not displayed]

FINDINGS: The cardiomediastinal silhouette is normal in contour. No pleural
effusion. No pneumothorax. No acute pleuroparenchymal abnormality.
Visualized abdomen is unremarkable. No acute osseous abnormality
noted.
IMPRESSION: No acute cardiopulmonary abnormality.

## 2023-01-21 DIAGNOSIS — J329 Chronic sinusitis, unspecified: Secondary | ICD-10-CM | POA: Diagnosis not present

## 2023-02-02 DIAGNOSIS — R5383 Other fatigue: Secondary | ICD-10-CM | POA: Diagnosis not present

## 2023-02-02 DIAGNOSIS — E8889 Other specified metabolic disorders: Secondary | ICD-10-CM | POA: Diagnosis not present

## 2023-02-02 DIAGNOSIS — Z13220 Encounter for screening for lipoid disorders: Secondary | ICD-10-CM | POA: Diagnosis not present

## 2023-02-02 DIAGNOSIS — R6889 Other general symptoms and signs: Secondary | ICD-10-CM | POA: Diagnosis not present

## 2023-02-02 DIAGNOSIS — G43709 Chronic migraine without aura, not intractable, without status migrainosus: Secondary | ICD-10-CM | POA: Diagnosis not present

## 2023-02-02 DIAGNOSIS — R7303 Prediabetes: Secondary | ICD-10-CM | POA: Diagnosis not present

## 2023-02-02 DIAGNOSIS — R232 Flushing: Secondary | ICD-10-CM | POA: Diagnosis not present

## 2023-02-02 DIAGNOSIS — Z6837 Body mass index (BMI) 37.0-37.9, adult: Secondary | ICD-10-CM | POA: Diagnosis not present

## 2023-02-02 DIAGNOSIS — F329 Major depressive disorder, single episode, unspecified: Secondary | ICD-10-CM | POA: Diagnosis not present

## 2023-02-02 DIAGNOSIS — E669 Obesity, unspecified: Secondary | ICD-10-CM | POA: Diagnosis not present

## 2023-02-23 DIAGNOSIS — E611 Iron deficiency: Secondary | ICD-10-CM | POA: Diagnosis not present

## 2023-02-23 DIAGNOSIS — R635 Abnormal weight gain: Secondary | ICD-10-CM | POA: Diagnosis not present

## 2023-02-23 DIAGNOSIS — R7303 Prediabetes: Secondary | ICD-10-CM | POA: Diagnosis not present

## 2023-02-23 DIAGNOSIS — R232 Flushing: Secondary | ICD-10-CM | POA: Diagnosis not present

## 2023-03-09 DIAGNOSIS — E611 Iron deficiency: Secondary | ICD-10-CM | POA: Diagnosis not present

## 2023-03-09 DIAGNOSIS — E669 Obesity, unspecified: Secondary | ICD-10-CM | POA: Diagnosis not present

## 2023-03-09 DIAGNOSIS — F329 Major depressive disorder, single episode, unspecified: Secondary | ICD-10-CM | POA: Diagnosis not present

## 2023-03-09 DIAGNOSIS — R635 Abnormal weight gain: Secondary | ICD-10-CM | POA: Diagnosis not present

## 2023-03-30 ENCOUNTER — Other Ambulatory Visit: Payer: Self-pay

## 2023-03-30 DIAGNOSIS — Z9189 Other specified personal risk factors, not elsewhere classified: Secondary | ICD-10-CM | POA: Diagnosis not present

## 2023-03-30 DIAGNOSIS — R635 Abnormal weight gain: Secondary | ICD-10-CM | POA: Diagnosis not present

## 2023-03-30 DIAGNOSIS — E611 Iron deficiency: Secondary | ICD-10-CM | POA: Diagnosis not present

## 2023-03-30 DIAGNOSIS — Z79899 Other long term (current) drug therapy: Secondary | ICD-10-CM | POA: Diagnosis not present

## 2023-03-30 DIAGNOSIS — E669 Obesity, unspecified: Secondary | ICD-10-CM | POA: Diagnosis not present

## 2023-03-30 DIAGNOSIS — Z6835 Body mass index (BMI) 35.0-35.9, adult: Secondary | ICD-10-CM | POA: Diagnosis not present

## 2023-03-30 MED ORDER — WEGOVY 0.5 MG/0.5ML ~~LOC~~ SOAJ
0.5000 mg | SUBCUTANEOUS | 0 refills | Status: DC
  Filled 2023-03-30: qty 2, 28d supply, fill #0

## 2023-04-20 ENCOUNTER — Other Ambulatory Visit: Payer: Self-pay

## 2023-04-20 DIAGNOSIS — E669 Obesity, unspecified: Secondary | ICD-10-CM | POA: Diagnosis not present

## 2023-04-20 DIAGNOSIS — Z6835 Body mass index (BMI) 35.0-35.9, adult: Secondary | ICD-10-CM | POA: Diagnosis not present

## 2023-04-20 DIAGNOSIS — E611 Iron deficiency: Secondary | ICD-10-CM | POA: Diagnosis not present

## 2023-04-20 MED ORDER — WEGOVY 0.5 MG/0.5ML ~~LOC~~ SOAJ
0.5000 mg | SUBCUTANEOUS | 0 refills | Status: DC
  Filled 2023-04-20: qty 2, 28d supply, fill #0

## 2023-04-25 ENCOUNTER — Other Ambulatory Visit: Payer: Self-pay

## 2023-05-16 ENCOUNTER — Telehealth: Payer: Self-pay | Admitting: Family Medicine

## 2023-05-16 NOTE — Telephone Encounter (Signed)
Patient called and requested to re establish care with Dr. Sharen Hones. Last appointment date was 01/07/2019. Please advise, thanks!

## 2023-05-16 NOTE — Telephone Encounter (Signed)
Ok to reestablish care.

## 2023-05-17 NOTE — Telephone Encounter (Signed)
Patient scheduled.

## 2023-05-18 ENCOUNTER — Other Ambulatory Visit: Payer: Self-pay

## 2023-05-18 DIAGNOSIS — E611 Iron deficiency: Secondary | ICD-10-CM | POA: Diagnosis not present

## 2023-05-18 DIAGNOSIS — R635 Abnormal weight gain: Secondary | ICD-10-CM | POA: Diagnosis not present

## 2023-05-18 MED ORDER — WEGOVY 1 MG/0.5ML ~~LOC~~ SOAJ
1.0000 mg | SUBCUTANEOUS | 0 refills | Status: DC
  Filled 2023-05-18 – 2023-05-31 (×2): qty 2, 28d supply, fill #0

## 2023-05-24 ENCOUNTER — Encounter: Payer: Self-pay | Admitting: Family Medicine

## 2023-05-24 ENCOUNTER — Ambulatory Visit: Payer: Federal, State, Local not specified - PPO | Admitting: Family Medicine

## 2023-05-24 VITALS — BP 118/70 | HR 80 | Temp 98.1°F | Ht 65.0 in | Wt 210.4 lb

## 2023-05-24 DIAGNOSIS — L700 Acne vulgaris: Secondary | ICD-10-CM | POA: Diagnosis not present

## 2023-05-24 DIAGNOSIS — G43709 Chronic migraine without aura, not intractable, without status migrainosus: Secondary | ICD-10-CM

## 2023-05-24 DIAGNOSIS — Z3041 Encounter for surveillance of contraceptive pills: Secondary | ICD-10-CM

## 2023-05-24 MED ORDER — CLINDAMYCIN PHOS-BENZOYL PEROX 1-5 % EX GEL: Freq: Every day | CUTANEOUS | 1 refills | Status: DC

## 2023-05-24 MED ORDER — NORETHINDRONE ACET-ETHINYL EST 1-20 MG-MCG PO TABS: 1.0000 | ORAL_TABLET | Freq: Every day | ORAL | 3 refills | Status: DC

## 2023-05-24 NOTE — Progress Notes (Unsigned)
Ph: [phone] Fax: [phone]   Patient ID: Amanda Stout, female    DOB: 1976/02/17, 47 y.o.   MRN: [id number]  This visit was conducted in person.  BP 118/70   Pulse 80   Temp 98.1 F (36.7 C) (Oral)   Ht 5\' 5"  (1.651 m)   Wt 210 lb 6 oz (95.4 kg)   LMP 05/05/2023   SpO2 99%   BMI 35.01 kg/m    CC:  re establish care  Subjective:   HPI: Amanda Stout is a 47 y.o. female presenting on 05/24/2023 for Re-establish Care (Here to re-establish care with Dr Reece Agar. C/o facial rash/acne. Also, wants to discuss changing OCP. )   New son age 64 yo at home.  I last saw patient 2019. In interim saw [facility] PCP.   H/o migraines associated with photo/phonophobia, nausea/vomiting, activity limiting. No aura prior to migraine, no vision changes. Previously saw [facility] neurology Dr Fransisca Kaufmann, last 02/2021 - note reviewed. Was on Emgality with significant benefit "life changing", with abortive naratriptan. Previous meds tried - amitriptyline, cymbalta, effexor, topamax, NSAIDs, relpax, metoprolol, excedrin, sumatriptan (caused heavy arm feeling and exhaustion). Has previously used occipital nerve blocks.   OCP - she has been on Yaz followed by [facility], now on generic low dose Seasonale (0.15-0.03 mg). Progesterone only OCP caused worsening depression.  Had IUD placed but it fell out.  Notes worsening acne since starting OCP.  Has seen dermatologist for this [facility]).  Upcoming OBGYN appt 07/2023.   Washes face twice daily with proactive product with benzyl peroxide.  Now using dial soap twice daily + differin gel.   LMP 05/05/2023  Regular periods 28-30 days. No heavy bleeding.   Seeing Dr Jeanice Lim at [facility] since 02/2023 - significant benefit. Recently on Wegovy.      Relevant past medical, surgical, family and social history reviewed and updated as indicated. Interim medical history since our last visit reviewed. Allergies and medications reviewed and updated. Outpatient  Medications Prior to Visit  Medication Sig Dispense Refill   aspirin-acetaminophen-caffeine (EXCEDRIN MIGRAINE) 250-250-65 MG tablet Take by mouth every 6 (six) hours as needed for headache.     EMGALITY 120 MG/ML SOAJ 120mg  Subcutaneous one a month     ibuprofen (ADVIL) 200 MG tablet Take 200 mg by mouth every 6 (six) hours as needed.     levonorgestrel-ethinyl estradiol (SEASONALE) 0.15-0.03 MG tablet 1 tablet Orally Once a day     Loratadine-Pseudoephedrine (CLARITIN-D 24 HOUR PO) Take by mouth.     naratriptan (AMERGE) 2.5 MG tablet Take 1 tablet (2.5 mg total) by mouth as needed for migraine. Take one (1) tablet at onset of headache; if returns or does not resolve, may repeat after 4 hours; do not exceed five (5) mg in 24 hours.     Semaglutide-Weight Management (WEGOVY) 0.5 MG/0.5ML SOAJ Inject 0.5 mg into the skin once a week. 2 mL 0   Semaglutide-Weight Management (WEGOVY) 1 MG/0.5ML SOAJ Inject 1 mg into the skin once a week. 2 mL 0   SUMAtriptan (IMITREX) 50 MG tablet TAKE 0.5-1 TABLET BY MOUTH ONCE FOR 1 DOSE. MAY REPEAT IN 2 HOURS IF HEADACHE PERSISTS OR RECURS. 10 tablet 3   DULoxetine (CYMBALTA) 20 MG capsule Take 1 capsule (20 mg total) by mouth 2 (two) times daily. 180 capsule 2   triamcinolone lotion (KENALOG) 0.1 % Apply to affected areas 1-2 times daily as needed for itch. Avoid face, groin, underarms. 60 mL 1  doxycycline (ADOXA) 100 MG tablet 1 by mouth twice a day with food 14 tablet 0   metoprolol tartrate (LOPRESSOR) 25 MG tablet Take 1 tablet (25 mg total) by mouth 2 (two) times daily as needed (for palpitations). 60 tablet 0   mupirocin ointment (BACTROBAN) 2 % Apply 1 application topically daily. With bandage change 22 g 0   norethindrone (MICRONOR) 0.35 MG tablet Take 1 tablet (0.35 mg total) by mouth daily. 1 Package 11   No facility-administered medications prior to visit.     Per HPI unless specifically indicated in ROS section below Review of  Systems  Objective:  BP 118/70   Pulse 80   Temp 98.1 F (36.7 C) (Oral)   Ht 5\' 5"  (1.651 m)   Wt 210 lb 6 oz (95.4 kg)   LMP 05/05/2023   SpO2 99%   BMI 35.01 kg/m   Wt Readings from Last 3 Encounters:  05/24/23 210 lb 6 oz (95.4 kg)  04/25/21 210 lb (95.3 kg)  01/07/19 223 lb (101.2 kg)      Physical Exam Vitals and nursing note reviewed.  Constitutional:      Appearance: Normal appearance. She is not ill-appearing.  HENT:     Head: Normocephalic and atraumatic.     Mouth/Throat:     Mouth: Mucous membranes are moist.     Pharynx: Oropharynx is clear. No oropharyngeal exudate or posterior oropharyngeal erythema.  Eyes:     Extraocular Movements: Extraocular movements intact.     Pupils: Pupils are equal, round, and reactive to light.  Cardiovascular:     Rate and Rhythm: Normal rate and regular rhythm.     Pulses: Normal pulses.     Heart sounds: Normal heart sounds. No murmur heard. Pulmonary:     Effort: Pulmonary effort is normal. No respiratory distress.     Breath sounds: Normal breath sounds. No wheezing, rhonchi or rales.  Musculoskeletal:     Right lower leg: No edema.     Left lower leg: No edema.     Comments:  Cystic acne into forehead, nose, chin, cheeks, lateral and posterior neck  Skin:    General: Skin is warm and dry.     Findings: No rash.  Neurological:     Mental Status: She is alert.  Psychiatric:        Mood and Affect: Mood normal.        Behavior: Behavior normal.       Lab Results  Component Value Date   NA 137 04/25/2021   CL 101 04/25/2021   K 3.9 04/25/2021   CO2 25 04/25/2021   BUN 11 04/25/2021   CREATININE 0.79 04/25/2021   GFRNONAA >60 04/25/2021   CALCIUM 9.2 04/25/2021   ALBUMIN 3.9 04/25/2021   GLUCOSE 90 04/25/2021    Lab Results  Component Value Date   TSH 0.64 10/31/2014   Assessment & Plan:   Problem List Items Addressed This Visit   None    Meds ordered this encounter  Medications    norethindrone-ethinyl estradiol (LOESTRIN) 1-20 MG-MCG tablet    Sig: Take 1 tablet by mouth daily.    Dispense:  28 tablet    Refill:  3   clindamycin-benzoyl peroxide (BENZACLIN) gel    Sig: Apply topically daily.    Dispense:  35 g    Refill:  1    No orders of the defined types were placed in this encounter.   Patient Instructions  Let me know closer  to date when you need Emgality refilled.  May try benzaclin combination skin care product once daily timing it to your routine. May continue differin once daily as well  Try loestrin new combination contraceptive sent to pharmacy  Keep GYN appointment 07/2023  Follow up plan: Return in about 6 months (around 11/21/2023), or if symptoms worsen or fail to improve, for annual exam, prior fasting for blood work.  Eustaquio Boyden, MD

## 2023-05-24 NOTE — Patient Instructions (Addendum)
Let me know closer to date when you need Emgality refilled.  May try benzaclin combination skin care product once daily timing it to your routine. May continue differin once daily as well  Try loestrin new combination contraceptive sent to pharmacy  Keep GYN appointment 07/2023

## 2023-05-25 ENCOUNTER — Encounter: Payer: Self-pay | Admitting: Family Medicine

## 2023-05-25 DIAGNOSIS — L700 Acne vulgaris: Secondary | ICD-10-CM | POA: Insufficient documentation

## 2023-05-25 DIAGNOSIS — Z309 Encounter for contraceptive management, unspecified: Secondary | ICD-10-CM | POA: Insufficient documentation

## 2023-05-25 NOTE — Assessment & Plan Note (Addendum)
Facial acne worse on combined contraceptive Seasonale (levonorgestrel 0.15mg  /ethinyl estradiol 0.03 mg).  Currently using OTC retinoic acid Differin BID.  With inflammatory cystic component, start combination product with antibiotic (Benzaclin) rec once daily to start, may continue Differin once daily. Reviewed importance of skin care routine. Discussed watch for bleaching effect of benzoyl peroxide. Update with effect.

## 2023-05-25 NOTE — Assessment & Plan Note (Addendum)
Established with Dr Felecia Jan bariatric clinic, on semaglutide with benefit.

## 2023-05-25 NOTE — Assessment & Plan Note (Addendum)
Reviewed significant increased of stroke with combined OCP in h/o migraine with aura as well as general increased cardiovascular risk of combined OCP. Denies migraines associated with aura. She does have fmhx CAD (father) and stroke (grandparents).  Bad experience with IUD - fell out and very painful. Not interested  in vaginal ring.  Progesterone only OCP caused worsening depression.  Currently on Seasonale (levonorgestrel 0.15mg  /ethinyl estradiol 0.03 mg) but notes worsened acne on this.  States Yaz/Beyaz (drospirenone/ethinyl estradiol) was more effective.  Will try loestrin (norethindrone ethinyl estradiol 1mg /0.02mg ).  Has upcoming GYN appt early 07/2023 - to further discuss options at that time.

## 2023-05-25 NOTE — Assessment & Plan Note (Addendum)
Endorses chronic migraines, but denies preceding aura.  Has previously tried multiple medications as per HPI, seen neurologists s/p occipital nerve blocks, most effective preventative treatment to date has been monthly Emgality.  Desires to continue this - Emgality monthly with sumatriptan/naratriptan abortively, prn ibuprofen, excedrin. She will let us know closer to date when she's running low on supply for [place] to start refill process.

## 2023-05-30 ENCOUNTER — Other Ambulatory Visit: Payer: Self-pay

## 2023-05-31 ENCOUNTER — Other Ambulatory Visit: Payer: Self-pay

## 2023-06-21 ENCOUNTER — Other Ambulatory Visit: Payer: Self-pay

## 2023-06-21 DIAGNOSIS — R110 Nausea: Secondary | ICD-10-CM | POA: Diagnosis not present

## 2023-06-21 DIAGNOSIS — E611 Iron deficiency: Secondary | ICD-10-CM | POA: Diagnosis not present

## 2023-06-21 DIAGNOSIS — Z79899 Other long term (current) drug therapy: Secondary | ICD-10-CM | POA: Diagnosis not present

## 2023-06-21 DIAGNOSIS — K5903 Drug induced constipation: Secondary | ICD-10-CM | POA: Diagnosis not present

## 2023-06-21 DIAGNOSIS — Z6833 Body mass index (BMI) 33.0-33.9, adult: Secondary | ICD-10-CM | POA: Diagnosis not present

## 2023-06-21 DIAGNOSIS — R635 Abnormal weight gain: Secondary | ICD-10-CM | POA: Diagnosis not present

## 2023-06-21 MED ORDER — ONDANSETRON HCL 4 MG PO TABS
4.0000 mg | ORAL_TABLET | Freq: Four times a day (QID) | ORAL | 0 refills | Status: DC | PRN
  Filled 2023-06-21: qty 20, 30d supply, fill #0

## 2023-06-21 MED ORDER — WEGOVY 1 MG/0.5ML ~~LOC~~ SOAJ
1.0000 mg | SUBCUTANEOUS | 0 refills | Status: DC
  Filled 2023-06-21: qty 2, 28d supply, fill #0

## 2023-07-03 ENCOUNTER — Encounter: Payer: Self-pay | Admitting: Family Medicine

## 2023-07-10 ENCOUNTER — Other Ambulatory Visit: Payer: Self-pay | Admitting: Family Medicine

## 2023-07-10 MED ORDER — VENLAFAXINE HCL 75 MG PO TABS: 75.0000 mg | ORAL_TABLET | Freq: Two times a day (BID) | ORAL | 2 refills | Status: DC

## 2023-07-10 NOTE — Telephone Encounter (Signed)
 Copied from CRM [id number]. Topic: Clinical - Medication Refill >> Jul 10, 2023 11:05 AM Antonio DEL wrote: Most Recent Primary Care Visit:  Provider: RILLA BALLER  Department: [facility]  Visit Type: OFFICE VISIT  Date: 05/24/2023  Medication: Venlasaxine 75 mg  Has the patient contacted their pharmacy? No (Agent: If no, request that the patient contact the pharmacy for the refill. If patient does not wish to contact the pharmacy document the reason why and proceed with request.) (Agent: If yes, when and what did the pharmacy advise?)  Is this the correct pharmacy for this prescription? Yes If no, delete pharmacy and type the correct one.  This is the patient's preferred pharmacy:  [facility], [address], [place], [place] [address]   Has the prescription been filled recently?   Is the patient out of the medication? Yes  Has the patient been seen for an appointment in the last year OR does the patient have an upcoming appointment? Yes  Can we respond through MyChart? Yes  Agent: Please be advised that Rx refills may take up to 3 business days. We ask that you follow-up with your pharmacy.

## 2023-07-17 ENCOUNTER — Ambulatory Visit: Payer: Federal, State, Local not specified - PPO | Admitting: Family Medicine

## 2023-07-19 ENCOUNTER — Ambulatory Visit: Payer: Federal, State, Local not specified - PPO | Admitting: Family Medicine

## 2023-07-19 ENCOUNTER — Other Ambulatory Visit: Payer: Self-pay

## 2023-07-19 ENCOUNTER — Encounter: Payer: Self-pay | Admitting: Family Medicine

## 2023-07-19 VITALS — BP 128/88 | HR 86 | Temp 98.2°F | Ht 65.0 in | Wt 195.4 lb

## 2023-07-19 DIAGNOSIS — K5903 Drug induced constipation: Secondary | ICD-10-CM | POA: Diagnosis not present

## 2023-07-19 DIAGNOSIS — Z6831 Body mass index (BMI) 31.0-31.9, adult: Secondary | ICD-10-CM | POA: Diagnosis not present

## 2023-07-19 DIAGNOSIS — E611 Iron deficiency: Secondary | ICD-10-CM | POA: Diagnosis not present

## 2023-07-19 DIAGNOSIS — G43109 Migraine with aura, not intractable, without status migrainosus: Secondary | ICD-10-CM | POA: Diagnosis not present

## 2023-07-19 DIAGNOSIS — N6311 Unspecified lump in the right breast, upper outer quadrant: Secondary | ICD-10-CM

## 2023-07-19 DIAGNOSIS — Z1211 Encounter for screening for malignant neoplasm of colon: Secondary | ICD-10-CM

## 2023-07-19 MED ORDER — ZEPBOUND 7.5 MG/0.5ML ~~LOC~~ SOAJ
7.5000 mg | SUBCUTANEOUS | 1 refills | Status: AC
  Filled 2023-07-19 – 2023-07-31 (×2): qty 2, 28d supply, fill #0

## 2023-07-19 MED ORDER — SUMATRIPTAN SUCCINATE 50 MG PO TABS: ORAL_TABLET | ORAL | 3 refills | Status: AC

## 2023-07-19 NOTE — Assessment & Plan Note (Signed)
 Order bilat dx mammo/R breast US . Manage according to results.

## 2023-07-19 NOTE — Patient Instructions (Addendum)
 We will order diagnostic mammogram and possible ultrasound to [facility] breast center Call to schedule at your convenience: [facility] at [facility] [phone] Good to see you today

## 2023-07-19 NOTE — Progress Notes (Signed)
 Ph: [phone] Fax: [phone]   Patient ID: Amanda Stout, female    DOB: 1975-07-21, 48 y.o.   MRN: [id number]  This visit was conducted in person.  BP 128/88   Pulse 86   Temp 98.2 F (36.8 C) (Oral)   Ht 5\' 5"  (1.651 m)   Wt 195 lb 6 oz (88.6 kg)   LMP 07/15/2023   SpO2 98%   BMI 32.51 kg/m    CC: lump in R breast  Subjective:   HPI: Amanda Stout is a 48 y.o. female presenting on 07/19/2023 for Breast Mass (C/o lump in R breast. Noticed 1 wk ago. Tried to schedule this yr's mammo and mentioned lump. Told a referral for diagnostic mammo is needed. )   Lump to R upper outer breast noted about a week ago. Nontender.   H/o benign cysts in the past - 2000s.  No known fmhx breast cancer. Mother with uterine cancer age 46yo.   Caffeine  intake - very limited, occ sips of coffee, occ excedrin  migraine tablet.   LMP 07/15/2023.  Last mammogram 07/2022 [facility] at [place].  Also requests referral for colonoscopy      Relevant past medical, surgical, family and social history reviewed and updated as indicated. Interim medical history since our last visit reviewed. Allergies and medications reviewed and updated. Outpatient Medications Prior to Visit  Medication Sig Dispense Refill   aspirin -acetaminophen -caffeine  (EXCEDRIN  MIGRAINE) 250-250-65 MG tablet Take by mouth every 6 (six) hours as needed for headache.     clindamycin -benzoyl peroxide (BENZACLIN) gel Apply topically daily. 35 g 1   EMGALITY  120 MG/ML SOAJ 120mg  Subcutaneous one a month     ibuprofen  (ADVIL ) 200 MG tablet Take 200 mg by mouth every 6 (six) hours as needed.     levonorgestrel -ethinyl estradiol (SEASONALE) 0.15-0.03 MG tablet 1 tablet Orally Once a day     Loratadine -Pseudoephedrine  (CLARITIN -D 24 HOUR PO) Take by mouth.     naratriptan (AMERGE) 2.5 MG tablet Take 1 tablet (2.5 mg total) by mouth as needed for migraine. Take one (1) tablet at onset of headache; if returns or does not resolve, may  repeat after 4 hours; do not exceed five (5) mg in 24 hours.     norethindrone -ethinyl estradiol (LOESTRIN) 1-20 MG-MCG tablet Take 1 tablet by mouth daily. 28 tablet 3   ondansetron  (ZOFRAN ) 4 MG tablet Take 1 tablet (4 mg total) by mouth every 6 (six) hours as needed. 20 tablet 0   Semaglutide -Weight Management (WEGOVY ) 0.5 MG/0.5ML SOAJ Inject 0.5 mg into the skin once a week. 2 mL 0   venlafaxine  (EFFEXOR ) 75 MG tablet Take 1 tablet (75 mg total) by mouth 2 (two) times daily. 60 tablet 2   WEGOVY  1 MG/0.5ML SOAJ Inject 1 mg into the skin once a week. 6 mL 0   SUMAtriptan  (IMITREX ) 50 MG tablet TAKE 0.5-1 TABLET BY MOUTH ONCE FOR 1 DOSE. MAY REPEAT IN 2 HOURS IF HEADACHE PERSISTS OR RECURS. 10 tablet 3   No facility-administered medications prior to visit.     Per HPI unless specifically indicated in ROS section below Review of Systems  Objective:  BP 128/88   Pulse 86   Temp 98.2 F (36.8 C) (Oral)   Ht 5\' 5"  (1.651 m)   Wt 195 lb 6 oz (88.6 kg)   LMP 07/15/2023   SpO2 98%   BMI 32.51 kg/m   Wt Readings from Last 3 Encounters:  07/19/23 195 lb 6 oz (88.6 kg)  05/24/23 210 lb 6 oz (95.4 kg)  04/25/21 210 lb (95.3 kg)      Physical Exam Vitals and nursing note reviewed.  Constitutional:      Appearance: Normal appearance. She is not ill-appearing.  Chest:     Chest wall: Mass present.  Breasts:    Breasts are symmetrical.     Right: Normal.     Left: Normal.       Comments: Small nontender lumps to R upper outer breast  Lymphadenopathy:     Upper Body:     Right upper body: No supraclavicular, axillary or pectoral adenopathy.     Left upper body: No supraclavicular, axillary or pectoral adenopathy.  Neurological:     Mental Status: She is alert.        Assessment & Plan:   Problem List Items Addressed This Visit     Breast lump on right side at 11 o'clock position - Primary   Order bilat dx mammo/R breast US . Manage according to results.       Relevant  Orders   MM Digital Diagnostic Bilat   US  LIMITED ULTRASOUND INCLUDING AXILLA RIGHT BREAST   Other Visit Diagnoses       Migraine with typical aura       Relevant Medications   SUMAtriptan  (IMITREX ) 50 MG tablet     Special screening for malignant neoplasms, colon       Relevant Orders   Ambulatory referral to Gastroenterology        Meds ordered this encounter  Medications   SUMAtriptan  (IMITREX ) 50 MG tablet    Sig: TAKE 0.5-1 TABLET BY MOUTH ONCE FOR 1 DOSE. MAY REPEAT IN 2 HOURS IF HEADACHE PERSISTS OR RECURS.    Dispense:  10 tablet    Refill:  3    Orders Placed This Encounter  Procedures   MM Digital Diagnostic Bilat    Standing Status:   Future    Expiration Date:   07/18/2024    Reason for Exam (SYMPTOM  OR DIAGNOSIS REQUIRED):   R breast lump at 11 o clock position    Is the patient pregnant?:   No    Preferred imaging location?:   [facility]   US  LIMITED ULTRASOUND INCLUDING AXILLA RIGHT BREAST    Standing Status:   Future    Expiration Date:   07/18/2024    Reason for Exam (SYMPTOM  OR DIAGNOSIS REQUIRED):   R breast lump at 11 o clock position    [facility]erred [facility]   Ambulatory referral to Gastroenterology    Referral Priority:   Routine    Referral Type:   Consultation    Referral Reason:   Specialty Services Required    Number of Visits Requested:   1    Patient Instructions  We will order diagnostic mammogram and possible ultrasound to [facility] breast center Call to schedule at your convenience: [facility] [phone] Good to see you today  Follow up plan: No follow-ups on file.  Claire Crick, MD

## 2023-07-31 ENCOUNTER — Other Ambulatory Visit: Payer: Self-pay

## 2023-07-31 MED ORDER — LOMAIRA 8 MG PO TABS
8.0000 mg | ORAL_TABLET | Freq: Every day | ORAL | 0 refills | Status: DC
  Filled 2023-07-31 – 2023-08-02 (×2): qty 30, 30d supply, fill #0

## 2023-08-02 ENCOUNTER — Ambulatory Visit
Admission: RE | Admit: 2023-08-02 | Discharge: 2023-08-02 | Disposition: A | Discharge status: Home / Self Care | Payer: Federal, State, Local not specified - PPO | Source: Ambulatory Visit | Attending: Family Medicine | Admitting: Family Medicine

## 2023-08-02 ENCOUNTER — Other Ambulatory Visit: Payer: Self-pay

## 2023-08-02 ENCOUNTER — Encounter: Payer: Self-pay | Admitting: *Deleted

## 2023-08-02 ENCOUNTER — Encounter: Payer: Self-pay | Admitting: Family Medicine

## 2023-08-02 DIAGNOSIS — N6311 Unspecified lump in the right breast, upper outer quadrant: Secondary | ICD-10-CM | POA: Insufficient documentation

## 2023-08-02 DIAGNOSIS — R92333 Mammographic heterogeneous density, bilateral breasts: Secondary | ICD-10-CM | POA: Diagnosis not present

## 2023-08-03 ENCOUNTER — Other Ambulatory Visit: Payer: Self-pay | Admitting: Family Medicine

## 2023-08-03 NOTE — Telephone Encounter (Signed)
Message from pharmacy:   REQUEST FOR 90 DAYS PRESCRIPTION.   Last rx:  07/10/23, #60/2 Last OV:  07/19/23, breast mass Next OV:  10/17/23, f/u

## 2023-08-11 ENCOUNTER — Other Ambulatory Visit: Payer: Self-pay | Admitting: Family Medicine

## 2023-08-11 DIAGNOSIS — G43709 Chronic migraine without aura, not intractable, without status migrainosus: Secondary | ICD-10-CM

## 2023-08-11 NOTE — Telephone Encounter (Signed)
 Last Fill: 09/01/22  Last OV: 07/19/23 Next OV: 10/17/23  Routing to provider for review/authorization.

## 2023-08-11 NOTE — Telephone Encounter (Signed)
 Copied from CRM [id number]. Topic: Clinical - Medication Refill >> Aug 11, 2023  2:11 PM Burnard DEL wrote: Most Recent Primary Care Visit:  Provider: RILLA BALLER  Department: [facility]  Visit Type: OFFICE VISIT  Date: 07/19/2023  Medication:  EMGALITY  120 MG/ML SOAJ    Has the patient contacted their pharmacy? Yes (Agent: If no, request that the patient contact the pharmacy for the refill. If patient does not wish to contact the pharmacy document the reason why and proceed with request.) (Agent: If yes, when and what did the pharmacy advise?)  Is this the correct pharmacy for this prescription? Yes If no, delete pharmacy and type the correct one.  This is the patient's preferred pharmacy:  [facility] - [place], [place] - [address]  Phone: [phone] Fax: [phone]      Has the prescription been filled recently? No  Is the patient out of the medication? No,1 week left  Has the patient been seen for an appointment in the last year OR does the patient have an upcoming appointment? Yes  Can we respond through MyChart? Yes  Agent: Please be advised that Rx refills may take up to 3 business days. We ask that you follow-up with your pharmacy.

## 2023-08-14 ENCOUNTER — Other Ambulatory Visit: Payer: Self-pay | Admitting: Family Medicine

## 2023-08-14 DIAGNOSIS — G43709 Chronic migraine without aura, not intractable, without status migrainosus: Secondary | ICD-10-CM

## 2023-08-14 DIAGNOSIS — Z13220 Encounter for screening for lipoid disorders: Secondary | ICD-10-CM

## 2023-08-14 DIAGNOSIS — Z131 Encounter for screening for diabetes mellitus: Secondary | ICD-10-CM

## 2023-08-14 DIAGNOSIS — Z1159 Encounter for screening for other viral diseases: Secondary | ICD-10-CM

## 2023-08-16 ENCOUNTER — Other Ambulatory Visit: Payer: Federal, State, Local not specified - PPO

## 2023-08-16 ENCOUNTER — Other Ambulatory Visit: Payer: Self-pay | Admitting: Family Medicine

## 2023-08-16 MED ORDER — EMGALITY 120 MG/ML ~~LOC~~ SOAJ: 120.0000 mg | SUBCUTANEOUS | 3 refills | Status: DC

## 2023-08-16 NOTE — Telephone Encounter (Signed)
I prescribed OCP back in 05/2023 while she was waiting to get in with GYN 07/2023 but plan was to transition to GYN to prescribe birth control.  Did she see her GYN 07/2023? If so would have her request refill through them.

## 2023-08-16 NOTE — Telephone Encounter (Signed)
ERx

## 2023-08-16 NOTE — Telephone Encounter (Signed)
Attempted to contact pt. No answer, no vm.

## 2023-08-17 NOTE — Telephone Encounter (Signed)
Lvm asking pt to call back.  Need to relay Dr. Timoteo Expose message and get answer to his question.

## 2023-08-21 NOTE — Addendum Note (Signed)
 Addended by: Nanci Pina on: 08/21/2023 02:44 PM   Modules accepted: Orders

## 2023-08-21 NOTE — Telephone Encounter (Addendum)
 Lvm asking pt to call back.  Need to relay Dr. Timoteo Expose message and get answer to his question.

## 2023-08-21 NOTE — Telephone Encounter (Signed)
 [facility] called in and stated that [facility]. She is needing it to be sent in to [facility] - [place], [place] - [address]. Thank you!

## 2023-08-21 NOTE — Telephone Encounter (Signed)
 Spoke with patient and she stated that she hasn't established with a GYN yet.

## 2023-08-22 MED ORDER — EMGALITY 120 MG/ML ~~LOC~~ SOAJ: 120.0000 mg | SUBCUTANEOUS | 3 refills | Status: DC

## 2023-08-22 NOTE — Telephone Encounter (Signed)
 ERx

## 2023-08-25 ENCOUNTER — Telehealth: Payer: Self-pay

## 2023-08-25 NOTE — Telephone Encounter (Signed)
 Pt requesting call back to schedule colonoscopy.

## 2023-08-28 ENCOUNTER — Other Ambulatory Visit: Payer: Self-pay

## 2023-08-28 ENCOUNTER — Telehealth: Payer: Self-pay

## 2023-08-28 DIAGNOSIS — Z1211 Encounter for screening for malignant neoplasm of colon: Secondary | ICD-10-CM

## 2023-08-28 MED ORDER — GOLYTELY 236 G PO SOLR: 4000.0000 mL | Freq: Once | ORAL | 0 refills | Status: AC

## 2023-08-28 NOTE — Telephone Encounter (Signed)
 okay

## 2023-08-28 NOTE — Telephone Encounter (Signed)
 Gastroenterology Pre-Procedure Review  Request Date: 12/01/23 Requesting Physician: Dr. Allegra Lai  PATIENT REVIEW QUESTIONS: The patient responded to the following health history questions as indicated:    1. Are you having any GI issues? no 2. Do you have a personal history of Polyps? no 3. Do you have a family history of Colon Cancer or Polyps? no 4. Diabetes Mellitus? no 5. Joint replacements in the past 12 months?no 6. Major health problems in the past 3 months?no 7. Any artificial heart valves, MVP, or defibrillator?no 8. Weight loss meds? Amanda Stout been advised to stop Wegovy/Ozempic (7) days prior to colonoscopy.  Instructions noted.  MEDICATIONS & ALLERGIES:    Patient reports the following regarding taking any anticoagulation/antiplatelet therapy:   Plavix, Coumadin, Eliquis, Xarelto, Lovenox, Pradaxa, Brilinta, or Effient? no Aspirin? no  Patient confirms/reports the following medications:  Current Outpatient Medications  Medication Sig Dispense Refill   ALTAVERA 0.15-30 MG-MCG tablet Take by mouth.     aspirin-acetaminophen-caffeine (EXCEDRIN MIGRAINE) 250-250-65 MG tablet Take by mouth every 6 (six) hours as needed for headache.     AUROVELA 1/20 1-20 MG-MCG tablet TAKE 1 TABLET BY MOUTH EVERY DAY 84 tablet 1   buPROPion (WELLBUTRIN XL) 150 MG 24 hr tablet Take 150 mg by mouth daily.     clindamycin-benzoyl peroxide (BENZACLIN) gel Apply topically daily. 35 g 1   EMGALITY 120 MG/ML SOAJ Inject 120 mg into the skin every 30 (thirty) days. 3 mL 3   ibuprofen (ADVIL) 200 MG tablet Take 200 mg by mouth every 6 (six) hours as needed.     levonorgestrel-ethinyl estradiol (SEASONALE) 0.15-0.03 MG tablet 1 tablet Orally Once a day     Loratadine-Pseudoephedrine (CLARITIN-D 24 HOUR PO) Take by mouth.     naratriptan (AMERGE) 2.5 MG tablet Take 1 tablet (2.5 mg total) by mouth as needed for migraine. Take one (1) tablet at onset of headache; if returns or does not resolve, may repeat  after 4 hours; do not exceed five (5) mg in 24 hours.     polyethylene glycol (GOLYTELY) 236 g solution Take 4,000 mLs by mouth once for 1 dose. 4000 mL 0   SUMAtriptan (IMITREX) 50 MG tablet TAKE 0.5-1 TABLET BY MOUTH ONCE FOR 1 DOSE. MAY REPEAT IN 2 HOURS IF HEADACHE PERSISTS OR RECURS. 10 tablet 3   venlafaxine (EFFEXOR) 75 MG tablet TAKE 1 TABLET BY MOUTH TWICE A DAY 180 tablet 1   WEGOVY 1 MG/0.5ML SOAJ Inject 1 mg into the skin once a week. 6 mL 0   ondansetron (ZOFRAN) 4 MG tablet Take 1 tablet (4 mg total) by mouth every 6 (six) hours as needed. (Patient not taking: Reported on 08/28/2023) 20 tablet 0   Phentermine HCl (LOMAIRA) 8 MG TABS Take 1 tablet (8 mg total) by mouth daily before breakfast. 30 tablet 0   Semaglutide-Weight Management (WEGOVY) 0.5 MG/0.5ML SOAJ Inject 0.5 mg into the skin once a week. 2 mL 0   tirzepatide (ZEPBOUND) 7.5 MG/0.5ML Pen Inject 7.5 mg into the skin once a week. (Patient not taking: Reported on 08/28/2023) 2 mL 1   No current facility-administered medications for this visit.    Patient confirms/reports the following allergies:  Allergies  Allergen Reactions   Lactose Intolerance (Gi) Other (See Comments)    GI UPSET  Other reaction(s): Other (See Comments)  GI UPSET    No orders of the defined types were placed in this encounter.   AUTHORIZATION INFORMATION Primary Insurance: 1D#: Group #:  Secondary Insurance:  1D#: Group #:  SCHEDULE INFORMATION: Date: 12/01/23 Time: Location: [facility]

## 2023-08-31 DIAGNOSIS — E611 Iron deficiency: Secondary | ICD-10-CM | POA: Diagnosis not present

## 2023-08-31 DIAGNOSIS — K5903 Drug induced constipation: Secondary | ICD-10-CM | POA: Diagnosis not present

## 2023-09-08 ENCOUNTER — Other Ambulatory Visit: Payer: Self-pay | Admitting: Family Medicine

## 2023-09-08 DIAGNOSIS — L700 Acne vulgaris: Secondary | ICD-10-CM

## 2023-09-11 NOTE — Telephone Encounter (Signed)
 Clindamycin-benzoyl gel Last filled:  07/11/23, #35 g Last OV:  07/19/23, R breast lump Next OV:  10/17/23, f/u

## 2023-09-25 ENCOUNTER — Other Ambulatory Visit: Payer: Self-pay | Admitting: Family Medicine

## 2023-09-25 NOTE — Telephone Encounter (Unsigned)
 Copied from CRM [id number]. Topic: Clinical - Medication Refill >> Sep 25, 2023 12:25 PM Alcus Dad wrote: Most Recent Primary Care Visit:  Provider: Eustaquio Boyden  Department: [facility]  Visit Type: OFFICE VISIT  Date: 07/19/2023  Medication: buPROPion (WELLBUTRIN XL) 150 MG 24 hr tablet  Has the patient contacted their pharmacy? No (Agent: If no, request that the patient contact the pharmacy for the refill. If patient does not wish to contact the pharmacy document the reason why and proceed with request.) (Agent: If yes, when and what did the pharmacy advise?)  Is this the correct pharmacy for this prescription? Yes If no, delete pharmacy and type the correct one.  This is the patient's preferred pharmacy:  [phone] [place], [place] - [address] [place] [address] Phone: [phone] Fax: [phone]  [facility] - [place], [place] - [address] [place] [address] Phone: [phone] Fax: [phone]   Has the prescription been filled recently? No  Is the patient out of the medication? No  Has the patient been seen for an appointment in the last year OR does the patient have an upcoming appointment? Yes  Can we respond through MyChart? Yes  Agent: Please be advised that Rx refills may take up to 3 business days. We ask that you follow-up with your pharmacy.

## 2023-10-05 ENCOUNTER — Other Ambulatory Visit: Payer: Self-pay | Admitting: Family Medicine

## 2023-10-05 DIAGNOSIS — F331 Major depressive disorder, recurrent, moderate: Secondary | ICD-10-CM

## 2023-10-05 MED ORDER — BUPROPION HCL ER (XL) 150 MG PO TB24: 150.0000 mg | ORAL_TABLET | Freq: Every day | ORAL | 1 refills | Status: DC

## 2023-10-05 NOTE — Telephone Encounter (Signed)
 Last OV:  07/19/23, breast lump Next OV:  10/17/23, f/u; needs CPE around 11/21/23 (see 05/24/23 OV notes)

## 2023-10-05 NOTE — Telephone Encounter (Signed)
 ERx

## 2023-10-05 NOTE — Telephone Encounter (Signed)
 Copied from CRM [id number]. Topic: Clinical - Medication Refill >> Oct 05, 2023  8:04 AM Elizebeth Brooking wrote: Most Recent Primary Care Visit:  Provider: Eustaquio Boyden  Department: [facility]  Visit Type: OFFICE VISIT  Date: 07/19/2023  Medication: buPROPion (WELLBUTRIN XL) 150 MG 24 hr tablet  Has the patient contacted their pharmacy? Yes (Agent: If no, request that the patient contact the pharmacy for the refill. If patient does not wish to contact the pharmacy document the reason why and proceed with request.) (Agent: If yes, when and what did the pharmacy advise?)  Is this the correct pharmacy for this prescription? Yes If no, delete pharmacy and type the correct one.  This is the patient's preferred pharmacy:  [id number] [place] [facility] - [address] [place] [address] Phone: [phone] Fax: [phone]     Has the prescription been filled recently? No  Is the patient out of the medication? Yes  Has the patient been seen for an appointment in the last year OR does the patient have an upcoming appointment? Yes  Can we respond through MyChart? Yes  Agent: Please be advised that Rx refills may take up to 3 business days. We ask that you follow-up with your pharmacy.

## 2023-10-12 DIAGNOSIS — F330 Major depressive disorder, recurrent, mild: Secondary | ICD-10-CM | POA: Diagnosis not present

## 2023-10-12 DIAGNOSIS — K5903 Drug induced constipation: Secondary | ICD-10-CM | POA: Diagnosis not present

## 2023-10-12 DIAGNOSIS — E611 Iron deficiency: Secondary | ICD-10-CM | POA: Diagnosis not present

## 2023-10-17 ENCOUNTER — Ambulatory Visit: Payer: Federal, State, Local not specified - PPO | Admitting: Family Medicine

## 2023-10-30 DIAGNOSIS — Z1329 Encounter for screening for other suspected endocrine disorder: Secondary | ICD-10-CM | POA: Diagnosis not present

## 2023-10-30 DIAGNOSIS — Z01419 Encounter for gynecological examination (general) (routine) without abnormal findings: Secondary | ICD-10-CM | POA: Diagnosis not present

## 2023-10-30 DIAGNOSIS — Z113 Encounter for screening for infections with a predominantly sexual mode of transmission: Secondary | ICD-10-CM | POA: Diagnosis not present

## 2023-10-30 DIAGNOSIS — Z130 Encounter for screening for diseases of the blood and blood-forming organs and certain disorders involving the immune mechanism: Secondary | ICD-10-CM | POA: Diagnosis not present

## 2023-10-30 DIAGNOSIS — Z202 Contact with and (suspected) exposure to infections with a predominantly sexual mode of transmission: Secondary | ICD-10-CM | POA: Diagnosis not present

## 2023-10-31 ENCOUNTER — Ambulatory Visit: Admitting: Family Medicine

## 2023-10-31 ENCOUNTER — Encounter: Payer: Self-pay | Admitting: Family Medicine

## 2023-10-31 VITALS — BP 124/84 | HR 88 | Temp 98.4°F | Ht 65.0 in | Wt 201.4 lb

## 2023-10-31 DIAGNOSIS — L700 Acne vulgaris: Secondary | ICD-10-CM | POA: Diagnosis not present

## 2023-10-31 DIAGNOSIS — F331 Major depressive disorder, recurrent, moderate: Secondary | ICD-10-CM | POA: Diagnosis not present

## 2023-10-31 DIAGNOSIS — F439 Reaction to severe stress, unspecified: Secondary | ICD-10-CM | POA: Diagnosis not present

## 2023-10-31 MED ORDER — ALPRAZOLAM 0.5 MG PO TABS
0.5000 mg | ORAL_TABLET | Freq: Two times a day (BID) | ORAL | 0 refills | Status: AC | PRN
Start: 2023-10-31 — End: ?

## 2023-10-31 MED ORDER — VENLAFAXINE HCL 75 MG PO TABS: 75.0000 mg | ORAL_TABLET | Freq: Two times a day (BID) | ORAL | 1 refills | Status: AC

## 2023-10-31 MED ORDER — DOXYCYCLINE HYCLATE 100 MG PO TABS: 100.0000 mg | ORAL_TABLET | Freq: Two times a day (BID) | ORAL | 1 refills | Status: DC

## 2023-10-31 MED ORDER — BUPROPION HCL ER (XL) 150 MG PO TB24: 150.0000 mg | ORAL_TABLET | Freq: Every day | ORAL | 1 refills | Status: DC

## 2023-10-31 NOTE — Progress Notes (Unsigned)
 Ph: [phone] Fax: [phone]   Patient ID: Amanda Stout, female    DOB: [birth year], 48 y.o.   MRN: [id number]  This visit was conducted in person.  BP 124/84   Pulse 88   Temp 98.4 F (36.9 C) (Oral)   Ht 5\' 5"  (1.651 m)   Wt 201 lb 6 oz (91.3 kg)   LMP 10/01/2023   SpO2 98%   BMI 33.51 kg/m    CC: discuss acne Subjective:   HPI: Amanda Stout is a 48 y.o. female presenting on 10/31/2023 for Medical Management of Chronic Issues (Here for skin f/u. Wants to discuss trying a new topical. )   Stressful period as a Manufacturing engineer.   Worsening acne since starting OCP.  Previously saw derm Dr Court Distance until she left practice. Requests referral to dermatology in [place].   Has been treating with Benzaclin - no significant improvement.   Saw GYN Dr Wayna Hails - planning to switch OCP to lower dose estrogen.  Prescribed Strivectin topically.  Scratching at night time, causing scarring to face.      Relevant past medical, surgical, family and social history reviewed and updated as indicated. Interim medical history since our last visit reviewed. Allergies and medications reviewed and updated. Outpatient Medications Prior to Visit  Medication Sig Dispense Refill   APRI 0.15-30 MG-MCG tablet Take 1 tablet by mouth daily.     aspirin -acetaminophen -caffeine  (EXCEDRIN  MIGRAINE) 250-250-65 MG tablet Take by mouth every 6 (six) hours as needed for headache.     AUROVELA 1/20 1-20 MG-MCG tablet TAKE 1 TABLET BY MOUTH EVERY DAY 84 tablet 1   clindamycin -benzoyl peroxide (BENZACLIN) gel APPLY TO AFFECTED AREA TOPICALLY EVERY DAY 50 g 1   EMGALITY  120 MG/ML SOAJ Inject 120 mg into the skin every 30 (thirty) days. 3 mL 3   ibuprofen  (ADVIL ) 200 MG tablet Take 200 mg by mouth every 6 (six) hours as needed.     levonorgestrel -ethinyl estradiol (SEASONALE) 0.15-0.03 MG tablet 1 tablet Orally Once a day     Loratadine -Pseudoephedrine  (CLARITIN -D 24 HOUR PO) Take by mouth.      naratriptan (AMERGE) 2.5 MG tablet Take 1 tablet (2.5 mg total) by mouth as needed for migraine. Take one (1) tablet at onset of headache; if returns or does not resolve, may repeat after 4 hours; do not exceed five (5) mg in 24 hours.     ondansetron  (ZOFRAN ) 4 MG tablet Take 1 tablet (4 mg total) by mouth every 6 (six) hours as needed. 20 tablet 0   spironolactone (ALDACTONE) 50 MG tablet Take 50 mg by mouth daily.     SUMAtriptan  (IMITREX ) 50 MG tablet TAKE 0.5-1 TABLET BY MOUTH ONCE FOR 1 DOSE. MAY REPEAT IN 2 HOURS IF HEADACHE PERSISTS OR RECURS. 10 tablet 3   tirzepatide  (ZEPBOUND ) 7.5 MG/0.5ML Pen Inject 7.5 mg into the skin once a week. 2 mL 1   buPROPion  (WELLBUTRIN  XL) 150 MG 24 hr tablet Take 1 tablet (150 mg total) by mouth daily. 90 tablet 1   Phentermine  HCl (LOMAIRA ) 8 MG TABS Take 1 tablet (8 mg total) by mouth daily before breakfast. 30 tablet 0   Semaglutide -Weight Management (WEGOVY ) 0.5 MG/0.5ML SOAJ Inject 0.5 mg into the skin once a week. 2 mL 0   venlafaxine  (EFFEXOR ) 75 MG tablet TAKE 1 TABLET BY MOUTH TWICE A DAY 180 tablet 1   WEGOVY  1 MG/0.5ML SOAJ Inject 1 mg into the skin once a week. 6 mL 0  ALTAVERA 0.15-30 MG-MCG tablet Take by mouth.     No facility-administered medications prior to visit.     Per HPI unless specifically indicated in ROS section below Review of Systems  Objective:  BP 124/84   Pulse 88   Temp 98.4 F (36.9 C) (Oral)   Ht 5\' 5"  (1.651 m)   Wt 201 lb 6 oz (91.3 kg)   LMP 10/01/2023   SpO2 98%   BMI 33.51 kg/m   Wt Readings from Last 3 Encounters:  10/31/23 201 lb 6 oz (91.3 kg)  07/19/23 195 lb 6 oz (88.6 kg)  05/24/23 210 lb 6 oz (95.4 kg)      Physical Exam    Results for orders placed or performed during the hospital encounter of 04/25/21  Comprehensive metabolic panel   Collection Time: 04/25/21  9:58 AM  Result Value Ref Range   Sodium 137 135 - 145 mmol/L   Potassium 3.9 3.5 - 5.1 mmol/L   Chloride 101 98 - 111 mmol/L    CO2 25 22 - 32 mmol/L   Glucose, Bld 90 70 - 99 mg/dL   BUN 11 6 - 20 mg/dL   Creatinine, Ser 4.09 0.44 - 1.00 mg/dL   Calcium 9.2 8.9 - 81.1 mg/dL   Total Protein 7.5 6.5 - 8.1 g/dL   Albumin 3.9 3.5 - 5.0 g/dL   AST 16 15 - 41 U/L   ALT 15 0 - 44 U/L   Alkaline Phosphatase 73 38 - 126 U/L   Total Bilirubin 0.6 0.3 - 1.2 mg/dL   GFR, Estimated >91 >47 mL/min   Anion gap 11 5 - 15  CBC with Differential   Collection Time: 04/25/21  9:58 AM  Result Value Ref Range   WBC 5.4 4.0 - 10.5 K/uL   RBC 4.54 3.87 - 5.11 MIL/uL   Hemoglobin 13.6 12.0 - 15.0 g/dL   HCT 82.9 56.2 - 13.0 %   MCV 88.8 80.0 - 100.0 fL   MCH 30.0 26.0 - 34.0 pg   MCHC 33.7 30.0 - 36.0 g/dL   RDW 86.5 78.4 - 69.6 %   Platelets 269 150 - 400 K/uL   nRBC 0.0 0.0 - 0.2 %   Neutrophils Relative % 53 %   Neutro Abs 2.9 1.7 - 7.7 K/uL   Lymphocytes Relative 33 %   Lymphs Abs 1.8 0.7 - 4.0 K/uL   Monocytes Relative 9 %   Monocytes Absolute 0.5 0.1 - 1.0 K/uL   Eosinophils Relative 4 %   Eosinophils Absolute 0.2 0.0 - 0.5 K/uL   Basophils Relative 1 %   Basophils Absolute 0.1 0.0 - 0.1 K/uL   Immature Granulocytes 0 %   Abs Immature Granulocytes 0.02 0.00 - 0.07 K/uL  Troponin I (High Sensitivity)   Collection Time: 04/25/21  9:58 AM  Result Value Ref Range   Troponin I (High Sensitivity) <2 <18 ng/L  D-dimer, quantitative   Collection Time: 04/25/21 10:54 AM  Result Value Ref Range   D-Dimer, Quant 0.48 0.00 - 0.50 ug/mL-FEU    Assessment & Plan:   Problem List Items Addressed This Visit     MDD (major depressive disorder), recurrent episode, moderate (HCC)   Relevant Medications   buPROPion  (WELLBUTRIN  XL) 150 MG 24 hr tablet   venlafaxine  (EFFEXOR ) 75 MG tablet   ALPRAZolam (XANAX) 0.5 MG tablet   Acne vulgaris - Primary   Relevant Medications   APRI 0.15-30 MG-MCG tablet   doxycycline  (VIBRA -TABS) 100 MG tablet  Other Relevant Orders   Ambulatory referral to Dermatology     Meds  ordered this encounter  Medications   doxycycline  (VIBRA -TABS) 100 MG tablet    Sig: Take 1 tablet (100 mg total) by mouth 2 (two) times daily. For 1 week then once daily as needed for acne    Dispense:  30 tablet    Refill:  1   buPROPion  (WELLBUTRIN  XL) 150 MG 24 hr tablet    Sig: Take 1 tablet (150 mg total) by mouth daily.    Dispense:  90 tablet    Refill:  1   venlafaxine  (EFFEXOR ) 75 MG tablet    Sig: Take 1 tablet (75 mg total) by mouth 2 (two) times daily.    Dispense:  180 tablet    Refill:  1   ALPRAZolam (XANAX) 0.5 MG tablet    Sig: Take 1 tablet (0.5 mg total) by mouth 2 (two) times daily as needed for anxiety.    Dispense:  20 tablet    Refill:  0    Orders Placed This Encounter  Procedures   Ambulatory referral to Dermatology    Referral Priority:   Routine    Referral Type:   Consultation    Referral Reason:   Specialty Services Required    Requested Specialty:   Dermatology    Number of Visits Requested:   1    Patient Instructions  We will refer you to dermatology to discuss acutane In interim, trial doxycycline  100mg  twice daily for 7 days then once daily as needed #30 RF 1.  Return in 4-6 months for physical and labs  Follow up plan: No follow-ups on file.  Claire Crick, MD

## 2023-10-31 NOTE — Patient Instructions (Addendum)
 We will refer you to dermatology to discuss acutane In interim, trial doxycycline  100mg  twice daily for 7 days then once daily as needed #30 RF 1.  Return in 4-6 months for physical and labs

## 2023-11-01 DIAGNOSIS — F439 Reaction to severe stress, unspecified: Secondary | ICD-10-CM | POA: Insufficient documentation

## 2023-11-01 NOTE — Assessment & Plan Note (Addendum)
 Facial acne worse on OCP.  Has previously tried OTC retinoic acid Differin BID.  Then prescribed benzaclin gel BID with limited effect - found skin was not really affected.  Ongoing scarring acne with inflammatory cystic component - start doxycycline  100mg  bid x1 wk then once daily, reviewed photosensitivity precautions.  Will refer to dermatology in [facility] for further eval/treatment.  She states she would be interested in Accutane treatment.

## 2023-11-01 NOTE — Assessment & Plan Note (Signed)
 Feels she's doing well on current regimen of wellbutrin  XL 150mg  daily + effexor  75mg  BID - will continue this.

## 2023-11-01 NOTE — Assessment & Plan Note (Addendum)
 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

## 2023-11-30 ENCOUNTER — Encounter: Payer: Self-pay | Admitting: Gastroenterology

## 2023-11-30 ENCOUNTER — Other Ambulatory Visit: Payer: Self-pay

## 2023-11-30 DIAGNOSIS — Z1211 Encounter for screening for malignant neoplasm of colon: Secondary | ICD-10-CM

## 2023-12-01 ENCOUNTER — Ambulatory Visit: Admitting: Anesthesiology

## 2023-12-01 ENCOUNTER — Encounter
Admission: RE | Disposition: A | Discharge status: Home / Self Care | Payer: Self-pay | Source: Ambulatory Visit | Attending: Gastroenterology

## 2023-12-01 ENCOUNTER — Ambulatory Visit
Admission: RE | Admit: 2023-12-01 | Discharge: 2023-12-01 | Disposition: A | Discharge status: Home / Self Care | Source: Ambulatory Visit | Attending: Gastroenterology | Admitting: Gastroenterology

## 2023-12-01 ENCOUNTER — Ambulatory Visit: Admit: 2023-12-01 | Payer: Federal, State, Local not specified - PPO | Admitting: Gastroenterology

## 2023-12-01 ENCOUNTER — Encounter: Payer: Self-pay | Admitting: Gastroenterology

## 2023-12-01 DIAGNOSIS — F32A Depression, unspecified: Secondary | ICD-10-CM | POA: Insufficient documentation

## 2023-12-01 DIAGNOSIS — R519 Headache, unspecified: Secondary | ICD-10-CM | POA: Insufficient documentation

## 2023-12-01 DIAGNOSIS — Z1211 Encounter for screening for malignant neoplasm of colon: Secondary | ICD-10-CM | POA: Diagnosis not present

## 2023-12-01 DIAGNOSIS — Z79899 Other long term (current) drug therapy: Secondary | ICD-10-CM | POA: Diagnosis not present

## 2023-12-01 HISTORY — DX: Acne vulgaris: L70.0

## 2023-12-01 LAB — POCT PREGNANCY, URINE: Preg Test, Ur: NEGATIVE

## 2023-12-01 SURGERY — COLONOSCOPY WITH PROPOFOL
Anesthesia: General

## 2023-12-01 SURGERY — COLONOSCOPY
Anesthesia: General

## 2023-12-01 MED ORDER — EPHEDRINE SULFATE-NACL 50-0.9 MG/10ML-% IV SOSY
PREFILLED_SYRINGE | INTRAVENOUS | Status: DC | PRN
  Administered 2023-12-01: 15 mg via INTRAVENOUS
  Administered 2023-12-01: 10 mg via INTRAVENOUS

## 2023-12-01 MED ORDER — LIDOCAINE HCL (CARDIAC) PF 100 MG/5ML IV SOSY
PREFILLED_SYRINGE | INTRAVENOUS | Status: DC | PRN
  Administered 2023-12-01: 60 mg via INTRAVENOUS

## 2023-12-01 MED ORDER — SODIUM CHLORIDE 0.9 % IV SOLN: INTRAVENOUS | Status: DC

## 2023-12-01 MED ORDER — PROPOFOL 500 MG/50ML IV EMUL
INTRAVENOUS | Status: DC | PRN
  Administered 2023-12-01: 75 ug/kg/min via INTRAVENOUS

## 2023-12-01 MED ORDER — LIDOCAINE HCL (PF) 2 % IJ SOLN
INTRAMUSCULAR | Status: AC
  Filled 2023-12-01: qty 5

## 2023-12-01 MED ORDER — PROPOFOL 10 MG/ML IV BOLUS
INTRAVENOUS | Status: DC | PRN
Start: 2023-12-01 — End: 2023-12-01
  Administered 2023-12-01: 50 mg via INTRAVENOUS
  Administered 2023-12-01: 30 mg via INTRAVENOUS
  Administered 2023-12-01: 20 mg via INTRAVENOUS

## 2023-12-01 MED ORDER — PROPOFOL 1000 MG/100ML IV EMUL
INTRAVENOUS | Status: AC
  Filled 2023-12-01: qty 100

## 2023-12-01 MED ORDER — DEXMEDETOMIDINE HCL IN NACL 80 MCG/20ML IV SOLN
INTRAVENOUS | Status: DC | PRN
  Administered 2023-12-01: 20 ug via INTRAVENOUS

## 2023-12-01 NOTE — Transfer of Care (Signed)
 Immediate Anesthesia Transfer of Care Note  Patient: Amanda Stout  Procedure(s) Performed: COLONOSCOPY  Patient Location: PACU  Anesthesia Type:General  Level of Consciousness: sedated  Airway & Oxygen Therapy: Patient Spontanous Breathing  Post-op Assessment: Report given to RN and Post -op Vital signs reviewed and stable  Post vital signs: Reviewed and stable  Last Vitals:  Vitals Value Taken Time  BP    Temp    Pulse 77 12/01/23 0753  Resp 19 12/01/23 0753  SpO2 100 % 12/01/23 0753  Vitals shown include unfiled device data.  Last Pain:  Vitals:   12/01/23 0707  TempSrc: Temporal         Complications: No notable events documented.

## 2023-12-01 NOTE — Op Note (Signed)
 [facility] Gastroenterology Patient Name: Amanda Stout Procedure Date: 12/01/2023 7:28 AM MRN: [id number] Account #: [id number] Date of Birth: [birth year] Admit Type: Outpatient Age: 48 Room: [facility] ENDO ROOM 3 Gender: Female Note Status: Finalized Instrument Name: Hyman Main [id number] Procedure:             Colonoscopy Indications:           Screening for colorectal malignant neoplasm Providers:             Marnee Sink MD, MD Referring MD:          Claire Crick (Referring MD) Medicines:             Propofol  per Anesthesia Complications:         No immediate complications. Procedure:             Pre-Anesthesia Assessment:                        - Prior to the procedure, a History and Physical was                         performed, and patient medications and allergies were                         reviewed. The patient's tolerance of previous                         anesthesia was also reviewed. The risks and benefits                         of the procedure and the sedation options and risks                         were discussed with the patient. All questions were                         answered, and informed consent was obtained. Prior                         Anticoagulants: The patient has taken no anticoagulant                         or antiplatelet agents. ASA Grade Assessment: II - A                         patient with mild systemic disease. After reviewing                         the risks and benefits, the patient was deemed in                         satisfactory condition to undergo the procedure.                        After obtaining informed consent, the colonoscope was                         passed under direct vision. Throughout the procedure,  the patient's blood pressure, pulse, and oxygen                         saturations were monitored continuously. The                         Colonoscope was introduced through the  anus and                         advanced to the the cecum, identified by appendiceal                         orifice and ileocecal valve. The colonoscopy was                         performed without difficulty. The patient tolerated                         the procedure well. The quality of the bowel                         preparation was excellent. Findings:      The perianal and digital rectal examinations were normal.      The colon (entire examined portion) appeared normal. Impression:            - The entire examined colon is normal.                        - No specimens collected. Recommendation:        - Discharge patient to home.                        - Resume previous diet.                        - Continue present medications.                        - Repeat colonoscopy in 10 years for screening                         purposes. Procedure Code(s):     --- Professional ---                        [id number], Colonoscopy, flexible; diagnostic, including                         collection of specimen(s) by brushing or washing, when                         performed (separate procedure) Diagnosis Code(s):     --- Professional ---                        Z12.11, Encounter for screening for malignant neoplasm                         of colon CPT copyright 2022 American Medical Association. All rights reserved. The codes documented in this report are preliminary and upon coder review may  be  revised to meet current compliance requirements. Marnee Sink MD, MD 12/01/2023 7:50:27 AM This report has been signed electronically. Number of Addenda: 0 Note Initiated On: 12/01/2023 7:28 AM Scope Withdrawal Time: 0 hours 6 minutes 29 seconds  Total Procedure Duration: 0 hours 11 minutes 27 seconds  Estimated Blood Loss:  Estimated blood loss: none.      [facility]

## 2023-12-01 NOTE — H&P (Signed)
 Amanda Sink, Amanda Stout [facility] [address], [address] [place], [place] [address] Phone: [phone] Fax : [phone]  Primary Care Physician:  Claire Crick, Amanda Stout Primary Gastroenterologist:  Dr. Ole Berkeley  Pre-Procedure History & Physical: HPI:  Amanda Stout is a 48 y.o. female is here for a screening colonoscopy.   Past Medical History:  Diagnosis Date   ACL injury tear 04/2015   right   Arthritis    L4-5   Chronic migraine    Complete tear of right ACL 04/10/2015   Cystic acne    Exposure to Zika virus 09/29/2016   Zika NAA urine serum done   Lower back pain 2010   s/p herniated disc   Migraine with aura 05/2017   since 2018 pregnancy   Perennial allergic rhinitis     Past Surgical History:  Procedure Laterality Date   KNEE ARTHROSCOPY WITH ANTERIOR CRUCIATE LIGAMENT (ACL) REPAIR Right 04/10/2015   Osa Blase, Amanda Stout;  [facility]   WISDOM TOOTH EXTRACTION  07/04/1996    Prior to Admission medications   Medication Sig Start Date End Date Taking? Authorizing Provider  ALPRAZolam  (XANAX ) 0.5 MG tablet Take 1 tablet (0.5 mg total) by mouth 2 (two) times daily as needed for anxiety. 10/31/23  Yes Claire Crick, Amanda Stout  aspirin -acetaminophen -caffeine  (EXCEDRIN  MIGRAINE) 250-250-65 MG tablet Take by mouth every 6 (six) hours as needed for headache.   Yes Provider, Historical, Amanda Stout  buPROPion  (WELLBUTRIN  XL) 150 MG 24 hr tablet Take 1 tablet (150 mg total) by mouth daily. 10/31/23  Yes Claire Crick, Amanda Stout  EMGALITY  120 MG/ML SOAJ Inject 120 mg into the skin every 30 (thirty) days. 08/22/23  Yes Claire Crick, Amanda Stout  ibuprofen  (ADVIL ) 200 MG tablet Take 200 mg by mouth every 6 (six) hours as needed.   Yes Provider, Historical, Amanda Stout  spironolactone (ALDACTONE) 50 MG tablet Take 50 mg by mouth daily. 10/30/23  Yes Provider, Historical, Amanda Stout  SUMAtriptan  (IMITREX ) 50 MG tablet TAKE 0.5-1 TABLET BY MOUTH ONCE FOR 1 DOSE. MAY REPEAT IN 2 HOURS IF HEADACHE PERSISTS OR RECURS.  07/19/23  Yes Claire Crick, Amanda Stout  venlafaxine  (EFFEXOR ) 75 MG tablet Take 1 tablet (75 mg total) by mouth 2 (two) times daily. 10/31/23  Yes Claire Crick, Amanda Stout  APRI 0.15-30 MG-MCG tablet Take 1 tablet by mouth daily. 10/30/23   Provider, Historical, Amanda Stout  AUROVELA 1/20 1-20 MG-MCG tablet TAKE 1 TABLET BY MOUTH EVERY DAY 08/22/23   Claire Crick, Amanda Stout  clindamycin -benzoyl peroxide (BENZACLIN) gel APPLY TO AFFECTED AREA TOPICALLY EVERY DAY 09/11/23   Claire Crick, Amanda Stout  doxycycline  (VIBRA -TABS) 100 MG tablet Take 1 tablet (100 mg total) by mouth 2 (two) times daily. For 1 week then once daily as needed for acne 10/31/23   Claire Crick, Amanda Stout  levonorgestrel -ethinyl estradiol (SEASONALE) 0.15-0.03 MG tablet 1 tablet Orally Once a day    Provider, Historical, Amanda Stout  Loratadine -Pseudoephedrine  (CLARITIN -D 24 HOUR PO) Take by mouth.    Provider, Historical, Amanda Stout  naratriptan (AMERGE) 2.5 MG tablet Take 1 tablet (2.5 mg total) by mouth as needed for migraine. Take one (1) tablet at onset of headache; if returns or does not resolve, may repeat after 4 hours; do not exceed five (5) mg in 24 hours. 05/24/23   Claire Crick, Amanda Stout  ondansetron  (ZOFRAN ) 4 MG tablet Take 1 tablet (4 mg total) by mouth every 6 (six) hours as needed. 06/21/23     tirzepatide  (ZEPBOUND ) 7.5 MG/0.5ML Pen Inject 7.5 mg into the skin once a week.  07/19/23       Allergies as of 11/30/2023 - Review Complete 11/30/2023  Allergen Reaction Noted   Lactose intolerance (gi) Other (See Comments) 04/06/2015    Family History  Problem Relation Age of Onset   Cancer Mother 27       uterine   Depression Mother    Hyperlipidemia Father    Hypertension Father    Arrhythmia Father    Heart attack Father 73   Stroke Maternal Grandmother    CAD Maternal Grandfather 45       MI   Hypertension Maternal Grandfather    Stroke Paternal Grandmother    CAD Paternal Grandfather 43       MI   Hypertension Paternal Grandfather     Social  History   Socioeconomic History   Marital status: Divorced    Spouse name: Not on file   Number of children: 1   Years of education: Not on file   Highest education level: Not on file  Occupational History   Not on file  Tobacco Use   Smoking status: Never   Smokeless tobacco: Never  Vaping Use   Vaping status: Never Used  Substance and Sexual Activity   Alcohol use: Yes    Comment: 3 x week, 1 serving of alcohol   Drug use: No   Sexual activity: Not Currently    Birth control/protection: Pill  Other Topics Concern   Not on file  Social History Narrative   Married 2016, has step son, son 2018   Divorced    Occupation: Product/process development scientist for [employer]   Edu: BS   Activity: walking dog, more intense exercises hurts back   Diet: good water, fruits/vegetables daily   Social Drivers of Corporate investment banker Strain: Not on file  Food Insecurity: Not on file  Transportation Needs: Not on file  Physical Activity: Not on file  Stress: Not on file  Social Connections: Not on file  Intimate Partner Violence: Not on file    Review of Systems: See HPI, otherwise negative ROS  Physical Exam: BP 112/78   Pulse 87   Temp (!) 96.5 F (35.8 C) (Temporal)   Resp 16   Ht 5\' 6"  (1.676 m)   Wt 89.3 kg   SpO2 100%   BMI 31.76 kg/m  General:   Alert,  pleasant and cooperative in NAD Head:  Normocephalic and atraumatic. Neck:  Supple; no masses or thyromegaly. Lungs:  Clear throughout to auscultation.    Heart:  Regular rate and rhythm. Abdomen:  Soft, nontender and nondistended. Normal bowel sounds, without guarding, and without rebound.   Neurologic:  Alert and  oriented x4;  grossly normal neurologically.  Impression/Plan: Amanda Stout is now here to undergo a screening colonoscopy.  Risks, benefits, and alternatives regarding colonoscopy have been reviewed with the patient.  Questions have been answered.  All parties agreeable.

## 2023-12-01 NOTE — Anesthesia Preprocedure Evaluation (Addendum)
 Anesthesia Evaluation  Patient identified by MRN, date of birth, ID band Patient awake    Reviewed: Allergy & Precautions, H&P , NPO status , Patient's Chart, lab work & pertinent test results  Airway Mallampati: II  TM Distance: >3 FB Neck ROM: full    Dental no notable dental hx.    Pulmonary neg pulmonary ROS   Pulmonary exam normal        Cardiovascular negative cardio ROS Normal cardiovascular exam     Neuro/Psych  Headaches PSYCHIATRIC DISORDERS  Depression       GI/Hepatic negative GI ROS, Neg liver ROS,,,  Endo/Other  negative endocrine ROS    Renal/GU negative Renal ROS  negative genitourinary   Musculoskeletal   Abdominal   Peds  Hematology negative hematology ROS (+)   Anesthesia Other Findings Past Medical History: 04/2015: ACL injury tear     Comment:  right No date: Arthritis     Comment:  L4-5 No date: Chronic migraine 04/10/2015: Complete tear of right ACL 09/29/2016: Exposure to Zika virus     Comment:  Zika NAA urine serum done 2010: Lower back pain     Comment:  s/p herniated disc 05/2017: Migraine with aura     Comment:  since 2018 pregnancy No date: Perennial allergic rhinitis  Past Surgical History: 04/10/2015: KNEE ARTHROSCOPY WITH ANTERIOR CRUCIATE LIGAMENT (ACL)  REPAIR; Right     Comment:  Osa Blase, MD;  [facility] 1998: WISDOM TOOTH EXTRACTION     Reproductive/Obstetrics negative OB ROS                             Anesthesia Physical Anesthesia Plan  ASA: 2  Anesthesia Plan: General   Post-op Pain Management:    Induction: Intravenous  PONV Risk Score and Plan: Propofol  infusion and TIVA  Airway Management Planned: Natural Airway  Additional Equipment:   Intra-op Plan:   Post-operative Plan:   Informed Consent: I have reviewed the patients History and Physical, chart, labs and discussed the procedure including the  risks, benefits and alternatives for the proposed anesthesia with the patient or authorized representative who has indicated his/her understanding and acceptance.     Dental Advisory Given  Plan Discussed with: CRNA and Surgeon  Anesthesia Plan Comments:         Anesthesia Quick Evaluation

## 2023-12-04 NOTE — Anesthesia Postprocedure Evaluation (Signed)
 Anesthesia Post Note  Patient: Amanda Stout  Procedure(s) Performed: COLONOSCOPY  Patient location during evaluation: PACU Anesthesia Type: General Level of consciousness: awake and alert Pain management: pain level controlled Vital Signs Assessment: post-procedure vital signs reviewed and stable Respiratory status: spontaneous breathing, nonlabored ventilation and respiratory function stable Cardiovascular status: blood pressure returned to baseline and stable Postop Assessment: no apparent nausea or vomiting Anesthetic complications: no   No notable events documented.   Last Vitals:  Vitals:   12/01/23 0804 12/01/23 0814  BP: (!) 90/57 95/60  Pulse: 75 77  Resp: (!) 21 19  Temp:    SpO2: 100% 100%    Last Pain:  Vitals:   12/01/23 0814  TempSrc:   PainSc: 0-No pain                 Baltazar Bonier

## 2023-12-05 ENCOUNTER — Other Ambulatory Visit: Payer: Self-pay | Admitting: Family Medicine

## 2023-12-05 NOTE — Telephone Encounter (Signed)
 Naratriptan Last OV:  10/31/23, acne f/u Next OV:  none

## 2023-12-05 NOTE — Telephone Encounter (Unsigned)
 Copied from CRM [id number]. Topic: Clinical - Medication Refill >> Dec 05, 2023  3:51 PM Amanda Stout E wrote: Medication:  naratriptan (AMERGE) 2.5 MG tablet  Has the patient contacted their pharmacy? Yes last refill was filled by previous provider   This is the patient's preferred pharmacy:  [id number] [place] [facility] - [address] [place] [address] Phone: [phone] Fax: [phone]   Is this the correct pharmacy for this prescription? Yes If no, delete pharmacy and type the correct one.   Has the prescription been filled recently? No  Is the patient out of the medication? Yes  Has the patient been seen for an appointment in the last year OR does the patient have an upcoming appointment? Yes  Can we respond through MyChart? Yes  Agent: Please be advised that Rx refills may take up to 3 business days. We ask that you follow-up with your pharmacy.

## 2023-12-07 DIAGNOSIS — E611 Iron deficiency: Secondary | ICD-10-CM | POA: Diagnosis not present

## 2023-12-07 DIAGNOSIS — F330 Major depressive disorder, recurrent, mild: Secondary | ICD-10-CM | POA: Diagnosis not present

## 2023-12-07 DIAGNOSIS — K5903 Drug induced constipation: Secondary | ICD-10-CM | POA: Diagnosis not present

## 2023-12-07 MED ORDER — NARATRIPTAN HCL 2.5 MG PO TABS: 2.5000 mg | ORAL_TABLET | ORAL | 1 refills | Status: DC | PRN

## 2023-12-07 NOTE — Telephone Encounter (Signed)
 ERx

## 2023-12-15 ENCOUNTER — Other Ambulatory Visit: Payer: Self-pay | Admitting: Family Medicine

## 2023-12-15 DIAGNOSIS — L700 Acne vulgaris: Secondary | ICD-10-CM

## 2023-12-15 NOTE — Telephone Encounter (Signed)
 Doxycycline  Last filled:  11/19/23, #30 Last OV:  10/31/23, acne f/u Next OV:  none

## 2024-01-10 ENCOUNTER — Other Ambulatory Visit (HOSPITAL_COMMUNITY): Payer: Self-pay

## 2024-01-10 ENCOUNTER — Telehealth: Payer: Self-pay

## 2024-01-10 NOTE — Telephone Encounter (Signed)
 Pharmacy Patient Advocate Encounter   Received notification from CoverMyMeds that prior authorization for Emgality  120MG /ML auto-injectors (migraine) is required/requested.   Insurance verification completed.   The patient is insured through [facility] .   Per test claim: PA required; PA submitted to above mentioned insurance via CoverMyMeds Key/confirmation #/EOC [id number] Status is pending

## 2024-01-11 ENCOUNTER — Other Ambulatory Visit (HOSPITAL_COMMUNITY): Payer: Self-pay

## 2024-01-11 NOTE — Telephone Encounter (Signed)
 Pharmacy Patient Advocate Encounter  Received notification from [facility] that Prior Authorization for Emgality  120MG /ML auto-injectors (migraine)  has been APPROVED from 12/12/2023 to 01/10/2025. Unable to obtain price due to refill too soon rejection, last fill date 12/26/2023 next available fill date07/17/2025   PA #/Case ID/Reference #: [id number]

## 2024-01-18 DIAGNOSIS — K5903 Drug induced constipation: Secondary | ICD-10-CM | POA: Diagnosis not present

## 2024-01-18 DIAGNOSIS — F330 Major depressive disorder, recurrent, mild: Secondary | ICD-10-CM | POA: Diagnosis not present

## 2024-01-18 DIAGNOSIS — E611 Iron deficiency: Secondary | ICD-10-CM | POA: Diagnosis not present

## 2024-02-02 ENCOUNTER — Emergency Department
Admission: EM | Admit: 2024-02-02 | Discharge: 2024-02-03 | Disposition: A | Discharge status: Home / Self Care | Attending: Emergency Medicine | Admitting: Emergency Medicine

## 2024-02-02 ENCOUNTER — Other Ambulatory Visit: Payer: Self-pay

## 2024-02-02 DIAGNOSIS — K8050 Calculus of bile duct without cholangitis or cholecystitis without obstruction: Secondary | ICD-10-CM | POA: Insufficient documentation

## 2024-02-02 DIAGNOSIS — R1010 Upper abdominal pain, unspecified: Secondary | ICD-10-CM | POA: Diagnosis not present

## 2024-02-02 LAB — LIPASE, BLOOD: Lipase: 45 U/L (ref 11–51)

## 2024-02-02 LAB — URINALYSIS, ROUTINE W REFLEX MICROSCOPIC
Bilirubin Urine: NEGATIVE
Glucose, UA: NEGATIVE mg/dL
Ketones, ur: NEGATIVE mg/dL
Nitrite: NEGATIVE
Protein, ur: NEGATIVE mg/dL
Specific Gravity, Urine: 1.017 (ref 1.005–1.030)
pH: 6 (ref 5.0–8.0)

## 2024-02-02 LAB — CBC
HCT: 35.7 % — ABNORMAL LOW (ref 36.0–46.0)
Hemoglobin: 12.3 g/dL (ref 12.0–15.0)
MCH: 31.5 pg (ref 26.0–34.0)
MCHC: 34.5 g/dL (ref 30.0–36.0)
MCV: 91.3 fL (ref 80.0–100.0)
Platelets: 299 K/uL (ref 150–400)
RBC: 3.91 MIL/uL (ref 3.87–5.11)
RDW: 12 % (ref 11.5–15.5)
WBC: 11.4 K/uL — ABNORMAL HIGH (ref 4.0–10.5)
nRBC: 0 % (ref 0.0–0.2)

## 2024-02-02 LAB — COMPREHENSIVE METABOLIC PANEL WITH GFR
ALT: 31 U/L (ref 0–44)
AST: 48 U/L — ABNORMAL HIGH (ref 15–41)
Albumin: 3.7 g/dL (ref 3.5–5.0)
Alkaline Phosphatase: 69 U/L (ref 38–126)
Anion gap: 8 (ref 5–15)
BUN: 14 mg/dL (ref 6–20)
CO2: 23 mmol/L (ref 22–32)
Calcium: 9.2 mg/dL (ref 8.9–10.3)
Chloride: 106 mmol/L (ref 98–111)
Creatinine, Ser: 0.98 mg/dL (ref 0.44–1.00)
GFR, Estimated: >60 mL/min (ref >60)
Glucose, Bld: 113 mg/dL — ABNORMAL HIGH (ref 70–99)
Potassium: 3.7 mmol/L (ref 3.5–5.1)
Sodium: 137 mmol/L (ref 135–145)
Total Bilirubin: 0.5 mg/dL (ref 0.0–1.2)
Total Protein: 7 g/dL (ref 6.5–8.1)

## 2024-02-02 LAB — POC URINE PREG, ED: Preg Test, Ur: NEGATIVE

## 2024-02-02 NOTE — ED Triage Notes (Signed)
 Patient states upper abdominal pain and nausea x 3 hours; denies vomiting and diarrhea.

## 2024-02-03 NOTE — ED Provider Notes (Signed)
 [facility] Provider Note    Event Date/Time   First MD Initiated Contact with Patient 02/02/24 2334     (approximate)   History   Abdominal Pain   HPI  Amanda Stout is a 48 y.o. female   who presents with complaints of upper abdominal pain, nausea which occurred for approximately 3 hours, she is feeling improved now.  She reports a similar episode in May of this year.  She reports the pain localized to her right upper quadrant.  No history of abdominal surgery reported      Physical Exam   Triage Vital Signs: ED Triage Vitals  Encounter Vitals Group     BP 02/02/24 1837 125/77     Girls Systolic BP Percentile --      Girls Diastolic BP Percentile --      Boys Systolic BP Percentile --      Boys Diastolic BP Percentile --      Pulse Rate 02/02/24 1837 71     Resp 02/02/24 1837 18     Temp 02/02/24 1837 97.8 F (36.6 C)     Temp Source 02/02/24 1837 Oral     SpO2 02/02/24 1837 100 %     Weight 02/02/24 1836 88.5 kg (195 lb)     Height 02/02/24 1836 1.676 m (5' 6)     Head Circumference --      Peak Flow --      Pain Score 02/02/24 1837 7     Pain Loc --      Pain Education --      Exclude from Growth Chart --     Most recent vital signs: Vitals:   02/03/24 0000 02/03/24 0013  BP: 125/88 125/84  Pulse: 72   Resp: 18   Temp: 98.2 F (36.8 C)   SpO2: 100%      General: Awake, no distress.  CV:  Good peripheral perfusion.  Resp:  Normal effort.  Abd:  No distention.  Abdomen, soft nontender Other:     ED Results / Procedures / Treatments   Labs (all labs ordered are listed, but only abnormal results are displayed) Labs Reviewed  COMPREHENSIVE METABOLIC PANEL WITH GFR - Abnormal; Notable for the following components:      Result Value   Glucose, Bld 113 (*)    AST 48 (*)    All other components within normal limits  CBC - Abnormal; Notable for the following components:   WBC 11.4 (*)    HCT 35.7 (*)    All other  components within normal limits  URINALYSIS, ROUTINE W REFLEX MICROSCOPIC - Abnormal; Notable for the following components:   Color, Urine YELLOW (*)    APPearance HAZY (*)    Hgb urine dipstick MODERATE (*)    Leukocytes,Ua TRACE (*)    Bacteria, UA RARE (*)    All other components within normal limits  LIPASE, BLOOD  POC URINE PREG, ED     EKG     RADIOLOGY     PROCEDURES:  Critical Care performed:   Procedures   MEDICATIONS ORDERED IN ED: Medications - No data to display   IMPRESSION / MDM / ASSESSMENT AND PLAN / ED COURSE  I reviewed the triage vital signs and the nursing notes. Patient's presentation is most consistent with acute illness / injury with system symptoms.  Patient presents with symptoms as above, given localization to the right upper quadrant suspicious for biliary colic.  She feels  well at this time and is asymptomatic.  Lab work reviewed and is overall quite reassuring.  We discussed the likelihood of this being biliary colic which I think is high, offered ultrasound here but she would prefer to have it done as an outpatient as it is late and she would like to go home and sleep given that she is feeling better now  I will refer her to general surgery for further workup, she knows she can return at any time.        FINAL CLINICAL IMPRESSION(S) / ED DIAGNOSES   Final diagnoses:  Biliary colic     Rx / DC Orders   ED Discharge Orders     None        Note:  This document was prepared using Dragon voice recognition software and may include unintentional dictation errors.   Arlander Charleston, MD 02/03/24 [phone]

## 2024-02-06 ENCOUNTER — Other Ambulatory Visit: Payer: Self-pay | Admitting: Surgery

## 2024-02-06 DIAGNOSIS — K8050 Calculus of bile duct without cholangitis or cholecystitis without obstruction: Secondary | ICD-10-CM

## 2024-02-07 ENCOUNTER — Ambulatory Visit: Payer: Self-pay | Admitting: Surgery

## 2024-02-07 ENCOUNTER — Ambulatory Visit
Admission: RE | Admit: 2024-02-07 | Discharge: 2024-02-07 | Disposition: A | Discharge status: Home / Self Care | Source: Ambulatory Visit | Attending: Surgery | Admitting: Surgery

## 2024-02-07 DIAGNOSIS — K8050 Calculus of bile duct without cholangitis or cholecystitis without obstruction: Secondary | ICD-10-CM | POA: Diagnosis not present

## 2024-02-07 DIAGNOSIS — K8020 Calculus of gallbladder without cholecystitis without obstruction: Secondary | ICD-10-CM | POA: Diagnosis not present

## 2024-02-07 NOTE — H&P (View-Only) (Signed)
 Subjective:    CC: Biliary colic [K80.50]   HPI:  referred by [facility] for evaluation of above CC.    History of Present Illness Amanda Stout is a 48 year old female who presents for follow-up after an emergency department visit for biliary colic.   She was diagnosed with biliary colic during an emergency department visit on February 02, 2024. An outpatient ultrasound was recommended but has not yet been scheduled. She recalls a similar episode in May 2025 after eating a roast beef sandwich. Since the emergency department visit, she has not experienced new symptoms related to biliary colic but has ongoing back pain and generalized bloating. She experiences constant generalized abdominal pain and bloating. She has IBS and lactose intolerance. Current medications include Zofran , Excedrin , and birth control pills.   No recent dosage adjustment to zepbound , excedrin .  RUQ is specific and new symptom.     Past Medical History:  has a past medical history of Allergy, Anxiety, Breast cyst, left, Chronic migraine, Depression, and Migraines.   Past Surgical History:  has a past surgical history that includes wisdom teeth extraction (1997) and Knee arthroscopy (Right, 04/2015).   Family History: family history includes Depression in her mother; Heart disease in her maternal grandfather, maternal grandmother, paternal grandfather, and paternal grandmother; High blood pressure (Hypertension) in her father; Myocardial Infarction (Heart attack) in her maternal grandfather; Stroke in her sister; Uterine cancer in her mother.   Social History:  reports that she has never smoked. She has never used smokeless tobacco. She reports current alcohol use of about 1.0 standard drink of alcohol per week. She reports that she does not use drugs.   Current Medications: has a current medication list which includes the following prescription(s): albuterol mdi (proventil, ventolin, proair) hfa, alprazolam ,  aspirin /acetaminophen /caffeine , bupropion , emgality  pen, levonorgestrel -ethinyl estradiol, loratadine -pseudoephedrine , naratriptan , and venlafaxine .   Allergies:       Allergies as of 02/06/2024 - Reviewed 02/06/2024  Allergen Reaction Noted   Lactose Other (See Comments) 04/06/2015      ROS:  A 15 point review of systems was performed and pertinent positives and negatives noted in HPI    Objective:    Objective BP 123/87   Pulse 84   Ht 167.6 cm (5' 6)   Wt 88.5 kg (195 lb)   LMP 01/28/2024   BMI 31.47 kg/m      Constitutional :  No distress, cooperative, alert  Lymphatics/Throat:  Supple with no lymphadenopathy  Respiratory:  Clear to auscultation bilaterally  Cardiovascular:  Regular rate and rhythm  Gastrointestinal: Soft, non-tender, non-distended, no organomegaly.  Musculoskeletal: Steady gait and movement  Skin: Cool and moist  Psychiatric: Normal affect, non-agitated, not confused         LABS:  N/a    RADS: Pending RUQ US  Assessment:    Assessment Biliary colic [K80.50]-story concerning enough for gallbladder issues.  Plan:    Plan 1. Biliary colic [K80.50] Discussed the risk of surgery including post-op infxn, seroma, biloma, chronic pain, poor-delayed wound healing, retained gallstone, conversion to open procedure, post-op SBO or ileus, and need for additional procedures to address said risks.  The risks of general anesthetic including MI, CVA, sudden death or even reaction to anesthetic medications also discussed. Alternatives include continued observation.  Benefits include possible symptom relief, prevention of complications including acute cholecystitis, pancreatitis.   Typical post operative recovery of 3-5 days rest, continued pain in area and incision sites, possible loose stools up to 4-6 weeks, also discussed.  ED return precautions given for sudden increase in RUQ pain, with possible accompanying fever, nausea, and/or vomiting.   The patient  understands the risks, any and all questions were answered to the patient's satisfaction.   labs/images/medications/previous chart entries reviewed personally and relevant changes/updates noted above.

## 2024-02-07 NOTE — H&P (Signed)
 Subjective:    CC: Biliary colic [K80.50]   HPI:  referred by [facility] for evaluation of above CC.    History of Present Illness Amanda Stout is a 48 year old female who presents for follow-up after an emergency department visit for biliary colic.   She was diagnosed with biliary colic during an emergency department visit on February 02, 2024. An outpatient ultrasound was recommended but has not yet been scheduled. She recalls a similar episode in May 2025 after eating a roast beef sandwich. Since the emergency department visit, she has not experienced new symptoms related to biliary colic but has ongoing back pain and generalized bloating. She experiences constant generalized abdominal pain and bloating. She has IBS and lactose intolerance. Current medications include Zofran , Excedrin , and birth control pills.   No recent dosage adjustment to zepbound , excedrin .  RUQ is specific and new symptom.     Past Medical History:  has a past medical history of Allergy, Anxiety, Breast cyst, left, Chronic migraine, Depression, and Migraines.   Past Surgical History:  has a past surgical history that includes wisdom teeth extraction (1997) and Knee arthroscopy (Right, 04/2015).   Family History: family history includes Depression in her mother; Heart disease in her maternal grandfather, maternal grandmother, paternal grandfather, and paternal grandmother; High blood pressure (Hypertension) in her father; Myocardial Infarction (Heart attack) in her maternal grandfather; Stroke in her sister; Uterine cancer in her mother.   Social History:  reports that she has never smoked. She has never used smokeless tobacco. She reports current alcohol use of about 1.0 standard drink of alcohol per week. She reports that she does not use drugs.   Current Medications: has a current medication list which includes the following prescription(s): albuterol mdi (proventil, ventolin, proair) hfa, alprazolam ,  aspirin /acetaminophen /caffeine , bupropion , emgality  pen, levonorgestrel -ethinyl estradiol, loratadine -pseudoephedrine , naratriptan , and venlafaxine .   Allergies:       Allergies as of 02/06/2024 - Reviewed 02/06/2024  Allergen Reaction Noted   Lactose Other (See Comments) 04/06/2015      ROS:  A 15 point review of systems was performed and pertinent positives and negatives noted in HPI    Objective:    Objective BP 123/87   Pulse 84   Ht 167.6 cm (5' 6)   Wt 88.5 kg (195 lb)   LMP 01/28/2024   BMI 31.47 kg/m      Constitutional :  No distress, cooperative, alert  Lymphatics/Throat:  Supple with no lymphadenopathy  Respiratory:  Clear to auscultation bilaterally  Cardiovascular:  Regular rate and rhythm  Gastrointestinal: Soft, non-tender, non-distended, no organomegaly.  Musculoskeletal: Steady gait and movement  Skin: Cool and moist  Psychiatric: Normal affect, non-agitated, not confused         LABS:  N/a    RADS: Pending RUQ US  Assessment:    Assessment Biliary colic [K80.50]-story concerning enough for gallbladder issues.  Plan:    Plan 1. Biliary colic [K80.50] Discussed the risk of surgery including post-op infxn, seroma, biloma, chronic pain, poor-delayed wound healing, retained gallstone, conversion to open procedure, post-op SBO or ileus, and need for additional procedures to address said risks.  The risks of general anesthetic including MI, CVA, sudden death or even reaction to anesthetic medications also discussed. Alternatives include continued observation.  Benefits include possible symptom relief, prevention of complications including acute cholecystitis, pancreatitis.   Typical post operative recovery of 3-5 days rest, continued pain in area and incision sites, possible loose stools up to 4-6 weeks, also discussed.  ED return precautions given for sudden increase in RUQ pain, with possible accompanying fever, nausea, and/or vomiting.   The patient  understands the risks, any and all questions were answered to the patient's satisfaction.   labs/images/medications/previous chart entries reviewed personally and relevant changes/updates noted above.

## 2024-02-09 DIAGNOSIS — F411 Generalized anxiety disorder: Secondary | ICD-10-CM | POA: Diagnosis not present

## 2024-02-10 ENCOUNTER — Other Ambulatory Visit: Payer: Self-pay | Admitting: Family Medicine

## 2024-02-10 DIAGNOSIS — L700 Acne vulgaris: Secondary | ICD-10-CM

## 2024-02-12 NOTE — Telephone Encounter (Signed)
 Doxycycline  Last filled:  01/19/24, #30 Last OV:  10/31/23, acne f/u Next OV:  none

## 2024-02-14 ENCOUNTER — Other Ambulatory Visit: Payer: Self-pay | Admitting: Family Medicine

## 2024-02-14 MED ORDER — CEFAZOLIN SODIUM-DEXTROSE 2-4 GM/100ML-% IV SOLN
2.0000 g | INTRAVENOUS | Status: AC
  Administered 2024-02-15: 2 g via INTRAVENOUS

## 2024-02-14 MED ORDER — INDOCYANINE GREEN 25 MG IV SOLR
1.2500 mg | Freq: Once | INTRAVENOUS | Status: AC
  Administered 2024-02-15: 1.25 mg via INTRAVENOUS

## 2024-02-14 MED ORDER — CHLORHEXIDINE GLUCONATE CLOTH 2 % EX PADS
6.0000 | MEDICATED_PAD | Freq: Once | CUTANEOUS | Status: AC
  Administered 2024-02-15: 6 via TOPICAL

## 2024-02-14 NOTE — Telephone Encounter (Signed)
 Rx discontinued on 02/14/2024 by Arloa Gibes T, CPhT for the following reason: Change in therapy.   Request denied.

## 2024-02-15 ENCOUNTER — Other Ambulatory Visit: Payer: Self-pay

## 2024-02-15 ENCOUNTER — Encounter: Payer: Self-pay | Admitting: Surgery

## 2024-02-15 ENCOUNTER — Ambulatory Visit: Admitting: Anesthesiology

## 2024-02-15 ENCOUNTER — Encounter
Admission: RE | Disposition: A | Discharge status: Home / Self Care | Payer: Self-pay | Source: Home / Self Care | Attending: Surgery

## 2024-02-15 ENCOUNTER — Ambulatory Visit
Admission: RE | Admit: 2024-02-15 | Discharge: 2024-02-15 | Disposition: A | Discharge status: Home / Self Care | Attending: Surgery | Admitting: Surgery

## 2024-02-15 DIAGNOSIS — Z01818 Encounter for other preprocedural examination: Secondary | ICD-10-CM

## 2024-02-15 DIAGNOSIS — K8010 Calculus of gallbladder with chronic cholecystitis without obstruction: Secondary | ICD-10-CM | POA: Insufficient documentation

## 2024-02-15 DIAGNOSIS — K8070 Calculus of gallbladder and bile duct without cholecystitis without obstruction: Secondary | ICD-10-CM | POA: Diagnosis not present

## 2024-02-15 LAB — POCT PREGNANCY, URINE: Preg Test, Ur: NEGATIVE

## 2024-02-15 SURGERY — CHOLECYSTECTOMY, ROBOT-ASSISTED, LAPAROSCOPIC
Anesthesia: General | Site: Abdomen

## 2024-02-15 MED ORDER — DEXMEDETOMIDINE HCL IN NACL 80 MCG/20ML IV SOLN
INTRAVENOUS | Status: DC | PRN
Start: 2024-02-15 — End: 2024-02-15
  Administered 2024-02-15 (×3): 4 ug via INTRAVENOUS

## 2024-02-15 MED ORDER — CHLORHEXIDINE GLUCONATE 0.12 % MT SOLN
15.0000 mL | Freq: Once | OROMUCOSAL | Status: AC
  Administered 2024-02-15: 15 mL via OROMUCOSAL

## 2024-02-15 MED ORDER — ROCURONIUM BROMIDE 10 MG/ML (PF) SYRINGE
PREFILLED_SYRINGE | INTRAVENOUS | Status: AC
  Filled 2024-02-15: qty 10

## 2024-02-15 MED ORDER — KETOROLAC TROMETHAMINE 30 MG/ML IJ SOLN
INTRAMUSCULAR | Status: AC
  Filled 2024-02-15: qty 1

## 2024-02-15 MED ORDER — MIDAZOLAM HCL 2 MG/2ML IJ SOLN
INTRAMUSCULAR | Status: AC
  Filled 2024-02-15: qty 2

## 2024-02-15 MED ORDER — FENTANYL CITRATE (PF) 100 MCG/2ML IJ SOLN: 25.0000 ug | INTRAMUSCULAR | Status: DC | PRN

## 2024-02-15 MED ORDER — FENTANYL CITRATE (PF) 100 MCG/2ML IJ SOLN
INTRAMUSCULAR | Status: AC
  Filled 2024-02-15: qty 2

## 2024-02-15 MED ORDER — SUGAMMADEX SODIUM 200 MG/2ML IV SOLN
INTRAVENOUS | Status: DC | PRN
  Administered 2024-02-15: 180 mg via INTRAVENOUS

## 2024-02-15 MED ORDER — PROPOFOL 10 MG/ML IV BOLUS
INTRAVENOUS | Status: AC
  Filled 2024-02-15: qty 20

## 2024-02-15 MED ORDER — ACETAMINOPHEN 10 MG/ML IV SOLN
INTRAVENOUS | Status: DC | PRN
  Administered 2024-02-15: 1000 mg via INTRAVENOUS

## 2024-02-15 MED ORDER — ONDANSETRON HCL 4 MG/2ML IJ SOLN
INTRAMUSCULAR | Status: DC | PRN
  Administered 2024-02-15: 4 mg via INTRAVENOUS

## 2024-02-15 MED ORDER — CEFAZOLIN SODIUM-DEXTROSE 2-4 GM/100ML-% IV SOLN
INTRAVENOUS | Status: AC
  Filled 2024-02-15: qty 100

## 2024-02-15 MED ORDER — PROPOFOL 10 MG/ML IV BOLUS
INTRAVENOUS | Status: DC | PRN
  Administered 2024-02-15: 170 mg via INTRAVENOUS

## 2024-02-15 MED ORDER — LIDOCAINE-EPINEPHRINE (PF) 1 %-1:200000 IJ SOLN
INTRAMUSCULAR | Status: DC | PRN
  Administered 2024-02-15: 30 mL via SUBCUTANEOUS

## 2024-02-15 MED ORDER — ORAL CARE MOUTH RINSE: 15.0000 mL | Freq: Once | OROMUCOSAL | Status: AC

## 2024-02-15 MED ORDER — LIDOCAINE-EPINEPHRINE (PF) 1 %-1:200000 IJ SOLN
INTRAMUSCULAR | Status: AC
  Filled 2024-02-15: qty 30

## 2024-02-15 MED ORDER — ONDANSETRON HCL 4 MG/2ML IJ SOLN
INTRAMUSCULAR | Status: AC
  Filled 2024-02-15: qty 2

## 2024-02-15 MED ORDER — LIDOCAINE HCL (CARDIAC) PF 100 MG/5ML IV SOSY
PREFILLED_SYRINGE | INTRAVENOUS | Status: DC | PRN
  Administered 2024-02-15: 80 mg via INTRAVENOUS

## 2024-02-15 MED ORDER — ACETAMINOPHEN 10 MG/ML IV SOLN
INTRAVENOUS | Status: AC
  Filled 2024-02-15: qty 100

## 2024-02-15 MED ORDER — CHLORHEXIDINE GLUCONATE 0.12 % MT SOLN
OROMUCOSAL | Status: AC
  Filled 2024-02-15: qty 15

## 2024-02-15 MED ORDER — ROCURONIUM BROMIDE 100 MG/10ML IV SOLN
INTRAVENOUS | Status: DC | PRN
  Administered 2024-02-15: 50 mg via INTRAVENOUS
  Administered 2024-02-15: 10 mg via INTRAVENOUS

## 2024-02-15 MED ORDER — DEXAMETHASONE SODIUM PHOSPHATE 10 MG/ML IJ SOLN
INTRAMUSCULAR | Status: DC | PRN
  Administered 2024-02-15: 10 mg via INTRAVENOUS

## 2024-02-15 MED ORDER — FENTANYL CITRATE (PF) 100 MCG/2ML IJ SOLN
INTRAMUSCULAR | Status: DC | PRN
  Administered 2024-02-15 (×4): 25 ug via INTRAVENOUS

## 2024-02-15 MED ORDER — DOCUSATE SODIUM 100 MG PO CAPS
100.0000 mg | ORAL_CAPSULE | Freq: Two times a day (BID) | ORAL | 0 refills | Status: AC | PRN
  Filled 2024-02-15: qty 20, 10d supply, fill #0

## 2024-02-15 MED ORDER — OXYCODONE HCL 5 MG PO TABS: 5.0000 mg | ORAL_TABLET | Freq: Once | ORAL | Status: DC | PRN

## 2024-02-15 MED ORDER — LACTATED RINGERS IV SOLN: INTRAVENOUS | Status: DC

## 2024-02-15 MED ORDER — DEXAMETHASONE SODIUM PHOSPHATE 10 MG/ML IJ SOLN
INTRAMUSCULAR | Status: AC
  Filled 2024-02-15: qty 1

## 2024-02-15 MED ORDER — 0.9 % SODIUM CHLORIDE (POUR BTL) OPTIME
TOPICAL | Status: DC | PRN
  Administered 2024-02-15: 500 mL

## 2024-02-15 MED ORDER — BUPIVACAINE HCL (PF) 0.5 % IJ SOLN
INTRAMUSCULAR | Status: AC
  Filled 2024-02-15: qty 30

## 2024-02-15 MED ORDER — MIDAZOLAM HCL 2 MG/2ML IJ SOLN
INTRAMUSCULAR | Status: DC | PRN
  Administered 2024-02-15: 2 mg via INTRAVENOUS

## 2024-02-15 MED ORDER — OXYCODONE HCL 5 MG/5ML PO SOLN: 5.0000 mg | Freq: Once | ORAL | Status: DC | PRN

## 2024-02-15 MED ORDER — KETOROLAC TROMETHAMINE 30 MG/ML IJ SOLN
INTRAMUSCULAR | Status: DC | PRN
  Administered 2024-02-15: 15 mg via INTRAVENOUS

## 2024-02-15 MED ORDER — LIDOCAINE HCL (PF) 2 % IJ SOLN
INTRAMUSCULAR | Status: AC
  Filled 2024-02-15: qty 5

## 2024-02-15 MED ORDER — ACETAMINOPHEN 325 MG PO TABS
650.0000 mg | ORAL_TABLET | Freq: Three times a day (TID) | ORAL | 0 refills | Status: AC | PRN
  Filled 2024-02-15: qty 40, 7d supply, fill #0

## 2024-02-15 MED ORDER — TRAMADOL HCL 50 MG PO TABS
50.0000 mg | ORAL_TABLET | Freq: Three times a day (TID) | ORAL | 0 refills | Status: AC | PRN
  Filled 2024-02-15: qty 6, 2d supply, fill #0

## 2024-02-15 SURGICAL SUPPLY — 39 items
ANCHOR TIS RET SYS 235ML (MISCELLANEOUS) ×1 IMPLANT
BAG PRESSURE INF REUSE 1000 (BAG) IMPLANT
CATH REDDICK CHOLANGI 4FR 50CM (CATHETERS) IMPLANT
CAUTERY HOOK MNPLR 1.6 DVNC XI (INSTRUMENTS) ×1 IMPLANT
CLIP LIGATING HEMO O LOK GREEN (MISCELLANEOUS) ×1 IMPLANT
DEFOGGER SCOPE WARM SEASHARP (MISCELLANEOUS) ×1 IMPLANT
DERMABOND ADVANCED .7 DNX12 (GAUZE/BANDAGES/DRESSINGS) ×1 IMPLANT
DRAPE ARM DVNC X/XI (DISPOSABLE) ×4 IMPLANT
DRAPE C-ARM XRAY 36X54 (DRAPES) IMPLANT
DRAPE COLUMN DVNC XI (DISPOSABLE) ×1 IMPLANT
ELECTRODE REM PT RTRN 9FT ADLT (ELECTROSURGICAL) ×1 IMPLANT
FORCEPS BPLR FENES DVNC XI (FORCEP) ×1 IMPLANT
FORCEPS PROGRASP DVNC XI (FORCEP) ×1 IMPLANT
GLOVE BIOGEL PI IND STRL 7.0 (GLOVE) ×2 IMPLANT
GLOVE SURG SYN 6.5 PF PI (GLOVE) ×4 IMPLANT
GOWN STRL REUS W/ TWL LRG LVL3 (GOWN DISPOSABLE) ×4 IMPLANT
GRASPER SUT TROCAR 14GX15 (MISCELLANEOUS) IMPLANT
IRRIGATOR SUCT 8 DISP DVNC XI (IRRIGATION / IRRIGATOR) IMPLANT
IV NS 1000ML BAXH (IV SOLUTION) IMPLANT
KIT TURNOVER KIT A (KITS) ×1 IMPLANT
LABEL OR SOLS (LABEL) ×1 IMPLANT
MANIFOLD NEPTUNE II (INSTRUMENTS) ×1 IMPLANT
NDL HYPO 22X1.5 SAFETY MO (MISCELLANEOUS) ×1 IMPLANT
NDL INSUFFLATION 14GA 120MM (NEEDLE) ×1 IMPLANT
NEEDLE HYPO 22X1.5 SAFETY MO (MISCELLANEOUS) ×1 IMPLANT
NEEDLE INSUFFLATION 14GA 120MM (NEEDLE) ×1 IMPLANT
NS IRRIG 500ML POUR BTL (IV SOLUTION) ×1 IMPLANT
OBTURATOR OPTICALSTD 8 DVNC (TROCAR) ×1 IMPLANT
PACK LAP CHOLECYSTECTOMY (MISCELLANEOUS) ×1 IMPLANT
SEAL UNIV 5-12 XI (MISCELLANEOUS) ×4 IMPLANT
SET TUBE SMOKE EVAC HIGH FLOW (TUBING) ×1 IMPLANT
SOLUTION ELECTROSURG ANTI STCK (MISCELLANEOUS) ×1 IMPLANT
SPIKE FLUID TRANSFER (MISCELLANEOUS) ×2 IMPLANT
SUT VICRYL 0 UR6 27IN ABS (SUTURE) ×1 IMPLANT
SUTURE MNCRL 4-0 27XMF (SUTURE) ×2 IMPLANT
SYR 30ML LL (SYRINGE) IMPLANT
SYSTEM WECK SHIELD CLOSURE (TROCAR) IMPLANT
TRAP FLUID SMOKE EVACUATOR (MISCELLANEOUS) ×1 IMPLANT
WATER STERILE IRR 500ML POUR (IV SOLUTION) ×1 IMPLANT

## 2024-02-15 NOTE — Transfer of Care (Signed)
 Immediate Anesthesia Transfer of Care Note  Patient: Amanda Stout  Procedure(s) Performed: CHOLECYSTECTOMY, ROBOT-ASSISTED, LAPAROSCOPIC (Abdomen)  Patient Location: PACU  Anesthesia Type:General  Level of Consciousness: drowsy and patient cooperative  Airway & Oxygen Therapy: Patient Spontanous Breathing and Patient connected to face mask oxygen  Post-op Assessment: Report given to RN and Post -op Vital signs reviewed and stable  Post vital signs: Reviewed and stable  Last Vitals:  Vitals Value Taken Time  BP 99/62 02/15/24 16:30  Temp 36.8 C 02/15/24 16:29  Pulse 75 02/15/24 16:30  Resp 22 02/15/24 16:30  SpO2 100 % 02/15/24 16:30  Vitals shown include unfiled device data.  Last Pain:  Vitals:   02/15/24 1629  TempSrc:   PainSc: Asleep         Complications: No notable events documented.

## 2024-02-15 NOTE — Op Note (Signed)
 Preoperative diagnosis:  chronic and cholecystitis  Postoperative diagnosis: same as above  Procedure: Robotic assisted Laparoscopic Cholecystectomy.   Anesthesia: GETA   Surgeon: Henriette Pierre  Specimen: Gallbladder  Complications: None  EBL: 15mL  Wound Classification: Clean Contaminated  Indications: see HPI  Findings: Critical view of safety noted Cystic duct and artery identified, ligated and divided, clips remained intact at end of procedure Adequate hemostasis  Description of procedure:  The patient was placed on the operating table in the supine position. SCDs placed, pre-op abx administered.  General anesthesia was induced and OG tube placed by anesthesia. A time-out was completed verifying correct patient, procedure, site, positioning, and implant(s) and/or special equipment prior to beginning this procedure. The abdomen was prepped and draped in the usual sterile fashion.    Veress needle was placed at the Palmer's point and insufflation was started after confirming a positive saline drop test and no immediate increase in abdominal pressure.  After reaching 15 mm, the Veress needle was removed and a 8 mm port was placed via optiview technique under umbilicus measured 20mm from gallbladder.  The abdomen was inspected and no abnormalities or injuries were found.  Under direct vision, ports were placed in the following locations: One 12 mm patient left of the umbilicus, 8cm from the periumbilical port, one 8 mm port placed to the patient right of the umbilical port 8 cm apart.  1 additional 8 mm port placed lateral to the 12mm port.  Once ports were placed, The table was placed in the reverse Trendelenburg position with the right side up. The Xi platform was brought into the operative field and docked to the ports successfully.  An endoscope was placed through the umbilical port, fenestrated grasper through the adjacent patient right port, prograsp to the far patient left port, and  then a hook cautery in the left port.  The dome of the gallbladder was grasped with prograsp, passed and retracted over the dome of the liver. Adhesions between the gallbladder and omentum, duodenum and transverse colon were lysed via hook cautery. The infundibulum was grasped with the fenestrated grasper and retracted toward the right lower quadrant. This maneuver exposed Calot's triangle. The peritoneum overlying the gallbladder infundibulum was then dissected  and the cystic duct and cystic artery identified.  Critical view of safety with the liver bed clearly visible behind the duct and artery with no additional structures noted.  The cystic duct and cystic artery clipped and divided close to the gallbladder.     The gallbladder was then dissected from its peritoneal and liver bed attachments by electrocautery. Hemostasis was checked prior to removing the hook cautery and the Endo Catch bag was then placed through the 12 mm port and the gallbladder was removed.  The gallbladder was passed off the table as a specimen. There was no evidence of bleeding from the gallbladder fossa or cystic artery or leakage of the bile from the cystic duct stump. The 12 mm port site closed with PMI using 0 vicryl under direct vision.  Abdomen desufflated and secondary trocars were removed under direct vision. No bleeding was noted. All skin incisions then closed with subcuticular sutures of 4-0 monocryl and dressed with topical skin adhesive. The orogastric tube was removed and patient extubated.  The patient tolerated the procedure well and was taken to the postanesthesia care unit in stable condition.  All sponge and instrument count correct at end of procedure.

## 2024-02-15 NOTE — Interval H&P Note (Signed)
 No change. OK to proceed.

## 2024-02-15 NOTE — Anesthesia Procedure Notes (Addendum)
 Procedure Name: Intubation Date/Time: 02/15/2024 3:18 PM  Performed by: Dyane Mass, CRNAPre-anesthesia Checklist: Patient identified, Emergency Drugs available, Suction available and Patient being monitored Patient Re-evaluated:Patient Re-evaluated prior to induction Oxygen Delivery Method: Circle system utilized Preoxygenation: Pre-oxygenation with 100% oxygen Induction Type: IV induction Ventilation: Mask ventilation without difficulty Laryngoscope Size: McGrath and 3 Grade View: Grade I Tube type: Oral Tube size: 7.0 mm Number of attempts: 1 Airway Equipment and Method: Stylet and Oral airway Placement Confirmation: ETT inserted through vocal cords under direct vision, positive ETCO2 and breath sounds checked- equal and bilateral Secured at: 20 cm Tube secured with: Tape Dental Injury: Teeth and Oropharynx as per pre-operative assessment

## 2024-02-15 NOTE — Discharge Instructions (Signed)
 Laparoscopic Cholecystectomy, Care After This sheet gives you information about how to care for yourself after your procedure. Your doctor may also give you more specific instructions. If you have problems or questions, contact your doctor. Follow these instructions at home: Care for cuts from surgery (incisions)  Follow instructions from your doctor about how to take care of your cuts from surgery. Make sure you: Wash your hands with soap and water before you change your bandage (dressing). If you cannot use soap and water, use hand sanitizer. Change your bandage as told by your doctor. Leave stitches (sutures), skin glue, or skin tape (adhesive) strips in place. They may need to stay in place for 2 weeks or longer. If tape strips get loose and curl up, you may trim the loose edges. Do not remove tape strips completely unless your doctor says it is okay. Do not take baths, swim, or use a hot tub until your doctor says it is okay. OK TO SHOWER 24HRS AFTER YOUR SURGERY.  Check your surgical cut area every day for signs of infection. Check for: More redness, swelling, or pain. More fluid or blood. Warmth. Pus or a bad smell. Activity Do not drive or use heavy machinery while taking prescription pain medicine. Do not play contact sports until your doctor says it is okay. Do not drive for 24 hours if you were given a medicine to help you relax (sedative). Rest as needed. Do not return to work or school until your doctor says it is okay. General instructions  tylenol and advil as needed for discomfort.  Please alternate between the two every four hours as needed for pain.    Use narcotics, if prescribed, only when tylenol and motrin is not enough to control pain.  325-650mg  every 8hrs to max of 3000mg /24hrs (including the 325mg  in every norco dose) for the tylenol.    Advil up to 800mg  per dose every 8hrs as needed for pain.   To prevent or treat constipation while you are taking prescription  pain medicine, your doctor may recommend that you: Drink enough fluid to keep your pee (urine) clear or pale yellow. Take over-the-counter or prescription medicines. Eat foods that are high in fiber, such as fresh fruits and vegetables, whole grains, and beans. Limit foods that are high in fat and processed sugars, such as fried and sweet foods. Contact a doctor if: You develop a rash. You have more redness, swelling, or pain around your surgical cuts. You have more fluid or blood coming from your surgical cuts. Your surgical cuts feel warm to the touch. You have pus or a bad smell coming from your surgical cuts. You have a fever. One or more of your surgical cuts breaks open. You have trouble breathing. You have chest pain. You have pain that is getting worse in your shoulders. You faint or feel dizzy when you stand. You have very bad pain in your belly (abdomen). You are sick to your stomach (nauseous) for more than one day. You have throwing up (vomiting) that lasts for more than one day. You have leg pain. This information is not intended to replace advice given to you by your health care provider. Make sure you discuss any questions you have with your health care provider. Document Released: 03/29/2008 Document Revised: 01/09/2016 Document Reviewed: 12/07/2015 Elsevier Interactive Patient Education  2019 [employer].

## 2024-02-16 ENCOUNTER — Inpatient Hospital Stay: Admission: RE | Admit: 2024-02-16 | Source: Ambulatory Visit

## 2024-02-19 LAB — SURGICAL PATHOLOGY

## 2024-02-19 NOTE — Anesthesia Postprocedure Evaluation (Signed)
 Anesthesia Post Note  Patient: Amanda Stout  Procedure(s) Performed: CHOLECYSTECTOMY, ROBOT-ASSISTED, LAPAROSCOPIC (Abdomen)  Patient location during evaluation: PACU Anesthesia Type: General Level of consciousness: awake and alert Pain management: pain level controlled Vital Signs Assessment: post-procedure vital signs reviewed and stable Respiratory status: spontaneous breathing, nonlabored ventilation and respiratory function stable Cardiovascular status: blood pressure returned to baseline and stable Postop Assessment: no apparent nausea or vomiting Anesthetic complications: no   No notable events documented.   Last Vitals:  Vitals:   02/15/24 1700 02/15/24 1715  BP: 114/79 118/73  Pulse: 78 80  Resp: 14 13  Temp:  36.9 C  SpO2: 100% 100%    Last Pain:  Vitals:   02/16/24 0928  TempSrc:   PainSc: 2                  Fairy POUR Clearance [place]

## 2024-02-20 ENCOUNTER — Encounter: Payer: Self-pay | Admitting: Family Medicine

## 2024-03-04 ENCOUNTER — Other Ambulatory Visit: Payer: Self-pay | Admitting: Family Medicine

## 2024-03-04 DIAGNOSIS — G43709 Chronic migraine without aura, not intractable, without status migrainosus: Secondary | ICD-10-CM

## 2024-03-05 NOTE — Telephone Encounter (Signed)
 Amerge Last filled:  01/26/24, #10 Last OV:  10/31/23, skin problem Next OV:  none

## 2024-03-12 ENCOUNTER — Encounter: Payer: Self-pay | Admitting: Surgery

## 2024-04-17 ENCOUNTER — Ambulatory Visit: Admitting: Physician Assistant

## 2024-04-17 ENCOUNTER — Encounter: Payer: Self-pay | Admitting: Physician Assistant

## 2024-04-17 VITALS — BP 130/90 | HR 81

## 2024-04-17 DIAGNOSIS — L705 Acne excoriee des jeunes filles: Secondary | ICD-10-CM

## 2024-04-17 DIAGNOSIS — L700 Acne vulgaris: Secondary | ICD-10-CM

## 2024-04-17 DIAGNOSIS — L209 Atopic dermatitis, unspecified: Secondary | ICD-10-CM

## 2024-04-17 MED ORDER — DOXYCYCLINE HYCLATE 100 MG PO CAPS: 100.0000 mg | ORAL_CAPSULE | Freq: Every day | ORAL | 0 refills | Status: DC

## 2024-04-17 MED ORDER — TRETINOIN 0.025 % EX CREA: TOPICAL_CREAM | Freq: Every day | CUTANEOUS | 3 refills | Status: AC

## 2024-04-17 MED ORDER — TRIAMCINOLONE ACETONIDE 0.1 % EX OINT: 1.0000 | TOPICAL_OINTMENT | Freq: Two times a day (BID) | CUTANEOUS | 2 refills | Status: AC | PRN

## 2024-04-17 NOTE — Patient Instructions (Addendum)

## 2024-04-17 NOTE — Progress Notes (Signed)
   New Patient Visit   Subjective  Amanda Stout is a 48 y.o. female NEW PATIENT who presents for the following: Acne  Patient states she has acne located at the face and neck that she would like to have examined. Patient reports the areas have been there for 7 years. She reports the areas are bothersome.Patient rates irritation 8 out of 10. She states that the areas have spread from her face to neck. Patient reports she has previously been treated for these areas, patient reports that she was prescribed spironolactone since January and doxycycline  for one year on and off. Patient denies Hx of bx. Patient admits family history of acne.  Was seen several years ago at [facility] and prescribed above medications. Does admit to high degree of anxiety and does pick at her acne.   Other concern: has known eczema - worse in Winter. Desires topical steroid when it may flare.    The following portions of the chart were reviewed this encounter and updated as appropriate: medications, allergies, medical history  Review of Systems:  No other skin or systemic complaints except as noted in HPI or Assessment and Plan.  Objective  Well appearing patient in no apparent distress; mood and affect are within normal limits.  A focused examination was performed of the following areas: face , back, chest and neck  Relevant exam findings are noted in the Assessment and Plan.           Assessment & Plan    ACNE EXCORIEE- FACE Exam: many excoriations with some resolving papules  (see photos)   Treatment:  - start BP wash in am and Vanicream at night  - start oral doxycyline and tretinoin as directed  - RTC 3 months  - try and avoid picking.  - brief discussion about isotretinoin to include risks, benefits and alternatives. 2 forms of birth control needed. I also require a note from her PCP that states her depression is under control and can proceed if that is what she opts in the future.     ATOPIC DERMATITIS - ARMS   Exam: CLEAR TODAY   Atopic dermatitis (eczema) is a chronic, relapsing, pruritic condition that can significantly affect quality of life. It is often associated with allergic rhinitis and/or asthma and can require treatment with topical medications, phototherapy, or in severe cases biologic injectable medication (Dupixent; Adbry) or Oral JAK inhibitors.  Treatment Plan: - topical steroids when needed. Rx sent in.   Recommend gentle skin care.  EXCORIATED ACNE   Related Medications tretinoin (RETIN-A) 0.025 % cream Apply topically at bedtime. Apply every other night building up to nightly doxycycline  (VIBRAMYCIN ) 100 MG capsule Take 1 capsule (100 mg total) by mouth daily. Take one a day with a heavy meal ATOPIC DERMATITIS, UNSPECIFIED TYPE    Return in about 3 months (around 07/18/2024) for Acne follow up.  I, Doyce Pan, CMA, am acting as scribe for Wynne Rozak K, PA-C.   Documentation: I have reviewed the above documentation for accuracy and completeness, and I agree with the above.  Alder Murri K, PA-C

## 2024-06-12 ENCOUNTER — Other Ambulatory Visit: Payer: Self-pay | Admitting: Physician Assistant

## 2024-06-12 DIAGNOSIS — L705 Acne excoriee des jeunes filles: Secondary | ICD-10-CM

## 2024-06-21 DIAGNOSIS — F411 Generalized anxiety disorder: Secondary | ICD-10-CM | POA: Diagnosis not present

## 2024-07-11 ENCOUNTER — Other Ambulatory Visit: Payer: Self-pay | Admitting: Family Medicine

## 2024-07-11 DIAGNOSIS — F331 Major depressive disorder, recurrent, moderate: Secondary | ICD-10-CM

## 2024-07-12 ENCOUNTER — Other Ambulatory Visit: Payer: Self-pay | Admitting: Family Medicine

## 2024-07-12 DIAGNOSIS — F331 Major depressive disorder, recurrent, moderate: Secondary | ICD-10-CM

## 2024-07-12 NOTE — Telephone Encounter (Unsigned)
 Copied from CRM #[id number]. Topic: Clinical - Medication Refill >> Jul 12, 2024  9:55 AM Victoria A wrote: Medication: buPROPion  (WELLBUTRIN  XL) 150 MG 24 hr tablet  Has the patient contacted their pharmacy? No (Agent: If no, request that the patient contact the pharmacy for the refill. If patient does not wish to contact the pharmacy document the reason why and proceed with request.) (Agent: If yes, when and what did the pharmacy advise?)  This is the patient's preferred pharmacy:  [id number] [address] [facility] - [address] [place] [address] Phone: [phone] Fax: [phone]  Is this the correct pharmacy for this prescription? Yes If no, delete pharmacy and type the correct one.   Has the prescription been filled recently? No  Is the patient out of the medication? No Has a few left  Has the patient been seen for an appointment in the last year OR does the patient have an upcoming appointment? Yes  Can we respond through MyChart? Yes  Agent: Please be advised that Rx refills may take up to 3 business days. We ask that you follow-up with your pharmacy.

## 2024-07-16 ENCOUNTER — Other Ambulatory Visit: Payer: Self-pay | Admitting: Physician Assistant

## 2024-07-16 DIAGNOSIS — L705 Acne excoriee des jeunes filles: Secondary | ICD-10-CM

## 2024-07-18 ENCOUNTER — Telehealth: Payer: Self-pay

## 2024-07-18 NOTE — Telephone Encounter (Signed)
 Patient called and requested a refill of doxycycline . She stated that she is flaring and getting sore. She has an appointment 07/31/2024.

## 2024-07-27 ENCOUNTER — Other Ambulatory Visit: Payer: Self-pay | Admitting: Family Medicine

## 2024-07-27 DIAGNOSIS — G43709 Chronic migraine without aura, not intractable, without status migrainosus: Secondary | ICD-10-CM

## 2024-07-29 NOTE — Telephone Encounter (Signed)
 Don't see where follow up is scheduled ok to refill?

## 2024-07-31 ENCOUNTER — Ambulatory Visit: Admitting: Physician Assistant

## 2024-07-31 ENCOUNTER — Encounter: Payer: Self-pay | Admitting: Physician Assistant

## 2024-07-31 VITALS — BP 139/89 | HR 84

## 2024-07-31 DIAGNOSIS — L705 Acne excoriee des jeunes filles: Secondary | ICD-10-CM

## 2024-07-31 MED ORDER — DOXYCYCLINE HYCLATE 100 MG PO TABS: 100.0000 mg | ORAL_TABLET | Freq: Every day | ORAL | 4 refills | Status: AC

## 2024-07-31 NOTE — Progress Notes (Signed)
" ° °  Follow-Up Visit   Subjective  Amanda Stout is a 49 y.o. female ESTABLISHED PATIENT who presents for the following: Acne follow up located on face and neck. Pt reports areas are bothersome. Pt is currently using tretinoin ,and BP wash and reports some effectiveness. She feels Vanicream products have helped. Pt endorses the following concerns: has a staph infection on left side of neck to ears towards cheeks and nose using doxycycline . Pt reports scratching and squeezing these areas of staph flares d/t pain and itchiness with drainage and feels large. Pt reports taking 1-2 day breaks from using tretinoin  between staph flares. She has seen a psychiatrist in the past. Pt has depression flares with chronic migraines. Acne excorie has been an ongoing problem now x ~ 7 years.    The following portions of the chart were reviewed this encounter and updated as appropriate: medications, allergies, medical history  Review of Systems:  No other skin or systemic complaints except as noted in HPI or Assessment and Plan.  Objective  Well appearing patient in no apparent distress; mood and affect are within normal limits.  A focused examination was performed of the following areas: Face and Neck   Relevant exam findings are noted in the Assessment and Plan.        Assessment & Plan   ACNE EXCORIEE- FACE/NECK - WORSENING  Exam: many excoriations with some resolving papules  (see photos)    Treatment:  - Continue BP wash in AM and Vanicream at night  - Continue Oral doxycyline once a day with food and plenty of fluid and tretinoin  as directed  - RTC 3 months  - Try and avoid picking to prevent further infection.   - Brief discussion about isotretinoin to include risks, benefits and alternatives. 2 forms of birth control needed. I also require a note from her PCP that states her depression is under control and can proceed if that is what she opts in the future.  - Recommend she see a psychiatrist  for component of OCD vs anxiety disorder.  EXCORIATED ACNE   Existing Treatments - tretinoin  (RETIN-A ) 0.025 % cream - Apply topically at bedtime. Apply every other night building up to nightly - doxycycline  (VIBRAMYCIN ) 100 MG capsule - TAKE 1 CAPSULE (100 MG TOTAL) BY MOUTH DAILY. TAKE ONE A DAY WITH A HEAVY MEAL  Return in about 5 months (around 12/29/2024) for Acne F/u.  I, Virgle Boards, Mohs/Dermatology Tech am acting as a neurosurgeon for Caton Popowski K, PA-C.  Documentation: I have reviewed the above documentation for accuracy and completeness, and I agree with the above.  Lener Ventresca K, PA-C\  "

## 2024-07-31 NOTE — Patient Instructions (Addendum)

## 2024-12-31 ENCOUNTER — Ambulatory Visit: Admitting: Physician Assistant
